# Patient Record
Sex: Female | Born: 1965
Health system: Southern US, Community
[De-identification: ages and names within clinical notes are randomized; demographics above are authoritative.]

## PROBLEM LIST (undated history)

## (undated) DIAGNOSIS — D649 Anemia, unspecified: Secondary | ICD-10-CM

## (undated) DIAGNOSIS — F419 Anxiety disorder, unspecified: Secondary | ICD-10-CM

## (undated) DIAGNOSIS — Z8489 Family history of other specified conditions: Secondary | ICD-10-CM

## (undated) HISTORY — PX: ABDOMINAL HYSTERECTOMY: SHX81

---

## 2000-08-19 ENCOUNTER — Emergency Department (HOSPITAL_COMMUNITY): Admission: EM | Admit: 2000-08-19 | Discharge: 2000-08-19 | Payer: Self-pay | Admitting: *Deleted

## 2003-09-24 ENCOUNTER — Other Ambulatory Visit: Admission: RE | Admit: 2003-09-24 | Discharge: 2003-09-24 | Payer: Self-pay | Admitting: Family Medicine

## 2011-01-04 ENCOUNTER — Emergency Department (HOSPITAL_BASED_OUTPATIENT_CLINIC_OR_DEPARTMENT_OTHER)
Admission: EM | Admit: 2011-01-04 | Discharge: 2011-01-04 | Disposition: A | Discharge status: Home / Self Care | Payer: Managed Care, Other (non HMO) | Attending: Emergency Medicine | Admitting: Emergency Medicine

## 2011-01-04 DIAGNOSIS — J069 Acute upper respiratory infection, unspecified: Secondary | ICD-10-CM | POA: Insufficient documentation

## 2011-01-04 LAB — RAPID STREP SCREEN (MED CTR MEBANE ONLY): Streptococcus, Group A Screen (Direct): NEGATIVE

## 2011-08-08 ENCOUNTER — Emergency Department (HOSPITAL_BASED_OUTPATIENT_CLINIC_OR_DEPARTMENT_OTHER)
Admission: EM | Admit: 2011-08-08 | Discharge: 2011-08-08 | Disposition: A | Discharge status: Home / Self Care | Payer: Managed Care, Other (non HMO) | Attending: Emergency Medicine | Admitting: Emergency Medicine

## 2011-08-08 DIAGNOSIS — R221 Localized swelling, mass and lump, neck: Secondary | ICD-10-CM | POA: Insufficient documentation

## 2011-08-08 DIAGNOSIS — S93609A Unspecified sprain of unspecified foot, initial encounter: Secondary | ICD-10-CM | POA: Insufficient documentation

## 2011-08-08 DIAGNOSIS — K047 Periapical abscess without sinus: Secondary | ICD-10-CM

## 2011-08-08 DIAGNOSIS — R220 Localized swelling, mass and lump, head: Secondary | ICD-10-CM | POA: Insufficient documentation

## 2011-08-08 DIAGNOSIS — K044 Acute apical periodontitis of pulpal origin: Secondary | ICD-10-CM | POA: Insufficient documentation

## 2011-08-08 DIAGNOSIS — X58XXXA Exposure to other specified factors, initial encounter: Secondary | ICD-10-CM | POA: Insufficient documentation

## 2011-08-08 DIAGNOSIS — S93601A Unspecified sprain of right foot, initial encounter: Secondary | ICD-10-CM

## 2011-08-08 MED ORDER — PENICILLIN V POTASSIUM 500 MG PO TABS: 500.0000 mg | ORAL_TABLET | Freq: Three times a day (TID) | ORAL | Status: AC

## 2011-08-08 MED ORDER — IBUPROFEN 800 MG PO TABS: 800.0000 mg | ORAL_TABLET | Freq: Three times a day (TID) | ORAL | Status: AC

## 2011-08-08 MED ORDER — HYDROCODONE-ACETAMINOPHEN 5-325 MG PO TABS: 1.0000 | ORAL_TABLET | ORAL | Status: AC | PRN

## 2011-08-08 NOTE — ED Notes (Signed)
Pt reports right side facial swelling, pain and "numbness" that started this am.  Also reports ankle pain.

## 2011-08-08 NOTE — ED Provider Notes (Signed)
History     CSN: [id number] Arrival date & time: 08/08/2011  4:00 PM   None     Chief Complaint  Patient presents with  . Facial Swelling  . Ankle Pain  . Dental Pain    (Consider location/radiation/quality/duration/timing/severity/associated sxs/prior treatment) HPI History provided by patient.  Pt develop right upper toothache yesterday evening.  This morning she woke w/ right cheek edema.  Has not had fever.  H/o dental abscess that presented similarly.  Denies trauma.  Also c/o pain in right proximal foot that started upon waking this am and aggravated by ankle ROM.  Denies trauma.  Denies recent increase in activity.  Family h/o gout.   History reviewed. No pertinent past medical history.  Past Surgical History  Procedure Date  . Cesarean section     No family history on file.  History  Substance Use Topics  . Smoking status: Never Smoker   . Smokeless tobacco: Never Used  . Alcohol Use: Yes     occasional wine    OB History    Grav Para Term Preterm Abortions TAB SAB Ect Mult Living                  Review of Systems  All other systems reviewed and are negative.    Allergies  Percocet  Home Medications   Current Outpatient Rx  Name Route Sig Dispense Refill  . NAPROXEN SODIUM 220 MG PO TABS Oral Take 240 mg by mouth 2 (two) times daily as needed. For pain       BP 129/84  Pulse 84  Temp(Src) 98.5 F (36.9 C) (Oral)  Resp 16  Ht 5\' 4"  (1.626 m)  Wt 210 lb (95.255 kg)  BMI 36.05 kg/m2  SpO2 100%  LMP 07/18/2011  Physical Exam  Nursing note and vitals reviewed. Constitutional: She is oriented to person, place, and time. She appears well-developed and well-nourished.  HENT:  Head: Normocephalic and atraumatic. No trismus in the jaw.  Mouth/Throat: Uvula is midline and oropharynx is clear and moist.       Severe decay and partial avulsions of nearly all teeth.  Right upper 2nd premolar ttp.  Adjacent gingiva appears normal.  Mild edema right  buccal mucosa w/ ttp.  Eyes:       Normal appearance  Neck: Normal range of motion. Neck supple.  Musculoskeletal:       Right foot/ankle w/out deformity, edema or skin changes.  Tenderness at center of proximal foot on dorsal surface.  Pain w/ ROM of ankle.  NV intact.    Lymphadenopathy:    She has no cervical adenopathy.  Neurological: She is alert and oriented to person, place, and time.  Psychiatric: She has a normal mood and affect. Her behavior is normal.    ED Course  Procedures (including critical care time)  Labs Reviewed - No data to display No results found.   1. Dental infection   2. Sprain of right foot       MDM  Pt presents w/ likely periapical abscess of right upper 2nd premolar.  Prescribed vicodin and penicillin and advised f/u with Dentist.  A list of several local dentists provided.  Also c/o non-traumatic right foot pain.  Concerned about FH gout.  No signs of infection/gout on exam.  Likely arthritis (has pain in other joints intermittently as well), sprain or tendonitis.  Recommended conservative therapy w/ NSAID, rest, ice.  Nursing staff placed in ASO.  Return precautions discussed.  Medical screening examination/treatment/procedure(s) were performed by non-physician practitioner and as supervising physician I was immediately available for consultation/collaboration. Osvaldo Human, M.D.      Arie Sabina [place], [place] 08/09/11 1610  Carleene Cooper III, MD 08/09/11 1046

## 2012-05-12 ENCOUNTER — Encounter (HOSPITAL_BASED_OUTPATIENT_CLINIC_OR_DEPARTMENT_OTHER): Payer: Self-pay | Admitting: *Deleted

## 2012-05-12 ENCOUNTER — Emergency Department (HOSPITAL_BASED_OUTPATIENT_CLINIC_OR_DEPARTMENT_OTHER)
Admission: EM | Admit: 2012-05-12 | Discharge: 2012-05-12 | Disposition: A | Discharge status: Home / Self Care | Payer: BC Managed Care – PPO | Attending: Emergency Medicine | Admitting: Emergency Medicine

## 2012-05-12 DIAGNOSIS — M79646 Pain in unspecified finger(s): Secondary | ICD-10-CM

## 2012-05-12 DIAGNOSIS — M79609 Pain in unspecified limb: Secondary | ICD-10-CM | POA: Insufficient documentation

## 2012-05-12 MED ORDER — SULFAMETHOXAZOLE-TMP DS 800-160 MG PO TABS
1.0000 | ORAL_TABLET | Freq: Once | ORAL | Status: AC
  Administered 2012-05-12: 1 via ORAL
  Filled 2012-05-12: qty 1

## 2012-05-12 MED ORDER — HYDROCODONE-ACETAMINOPHEN 5-325 MG PO TABS: 1.0000 | ORAL_TABLET | ORAL | Status: AC | PRN

## 2012-05-12 MED ORDER — SULFAMETHOXAZOLE-TRIMETHOPRIM 800-160 MG PO TABS: 1.0000 | ORAL_TABLET | Freq: Two times a day (BID) | ORAL | Status: AC

## 2012-05-12 MED ORDER — HYDROCODONE-ACETAMINOPHEN 5-325 MG PO TABS
1.0000 | ORAL_TABLET | Freq: Once | ORAL | Status: AC
  Administered 2012-05-12: 1 via ORAL
  Filled 2012-05-12: qty 1

## 2012-05-12 NOTE — ED Notes (Signed)
Acrylic nail was taken off her right 2nd digit today because she thinks she has an infection under the nail. Painful.

## 2012-05-12 NOTE — ED Provider Notes (Signed)
Medical screening examination/treatment/procedure(s) were performed by non-physician practitioner and as supervising physician I was immediately available for consultation/collaboration.  Ethelda Chick, MD 05/12/12 [phone]

## 2012-05-12 NOTE — ED Provider Notes (Signed)
History     CSN: [id number]  Arrival date & time 05/12/12  1729   First MD Initiated Contact with Patient 05/12/12 1903      Chief Complaint  Patient presents with  . Nail Problem    (Consider location/radiation/quality/duration/timing/severity/associated sxs/prior treatment) Patient is a 46 y.o. female presenting with hand pain. The history is provided by the patient.  Hand Pain This is a new problem. The current episode started yesterday. The problem occurs constantly. Pertinent negatives include no fever. Associated symptoms comments: She began having pain to distal right index finger yesterday, worse today. She went to her nail salon and had the acrylic nail removed and presents with increasing pain throughout today. No redness. No drainage around the nail. Marland Kitchen    History reviewed. No pertinent past medical history.  Past Surgical History  Procedure Date  . Cesarean section     No family history on file.  History  Substance Use Topics  . Smoking status: Never Smoker   . Smokeless tobacco: Never Used  . Alcohol Use: Yes     occasional wine    OB History    Grav Para Term Preterm Abortions TAB SAB Ect Mult Living                  Review of Systems  Constitutional: Negative for fever.  Musculoskeletal:       See HPI.  Skin: Negative for color change.    Allergies  Percocet  Home Medications   Current Outpatient Rx  Name Route Sig Dispense Refill  . ACETAMINOPHEN 500 MG PO TABS Oral Take 1,000 mg by mouth every 6 (six) hours as needed. For pain      BP 158/104  Pulse 103  Temp 98.3 F (36.8 C) (Oral)  Resp 21  SpO2 100%  Physical Exam  Constitutional: She is oriented to person, place, and time. She appears well-developed and well-nourished. No distress.  Musculoskeletal:       Right index finger unremarkable in appearance. Tender to palpation lateral finger distally. Pad soft, no evidence of felon development. No purulence around nail border. No  subungual hematoma. Minimal swelling lateral distal finger where tender.   Neurological: She is alert and oriented to person, place, and time.    ED Course  Procedures (including critical care time)  Labs Reviewed - No data to display No results found.   No diagnosis found.  1. Finger pain   MDM  Will place on abx and encourage 2 day recheck if no better or if worsens.        Rodena Medin, PA-C 05/12/12 1922

## 2013-01-26 NOTE — Consult Note (Deleted)
[name], [name]             ACCOUNT NO.:  [id number]  MEDICAL RECORD NO.:  [id number]  LOCATION:  [facility]                         FACILITY:  [facility]  PHYSICIAN:  Duke Salvia. Marcelle Overlie, M.D.DATE OF BIRTH:  1966/07/18  DATE OF CONSULTATION: DATE OF DISCHARGE:                                CONSULTATION   CHIEF COMPLAINT:  Menorrhagia, leiomyoma.  HISTORY OF PRESENT ILLNESS:  A 47 year old, G5, P5, LC6, one set of twins prior tubal.  This patient was originally seen on October 13th with complaints of anemia and menorrhagia.  Evaluation in November 2013 by [facility] showed multiple small fibroids 2.8, 3.2, 2.1, 2.6, 2.1 and possible fundal submucous fibroid adnexa negative.  Hemoglobin at that time was 8.8.  She was started on OCPs to decrease her menorrhagia along with iron.  Because of her smoking history, she understood that this was not going to be long range solution for her bleeding.  This was worsened over the last 4-6 months.  She presents at this time for definitive LAVH with bilateral salpingectomy.  This procedure including specific risks related to bleeding, infection, transfusion, adjacent organ injury, the possible need to complete surgery by open technique.  Other risks related to wound infection and phlebitis along with her expected recovery time reviewed, which she understands and accepts.  PAST MEDICAL HISTORY:  Allergies none.  CURRENT MEDICATIONS:  Ferralet 90.  SURGICAL HISTORY:  Five vaginal deliveries, one cesarean section.  She has had hand surgery.  REVIEW OF SYSTEMS:  Significant for prior history of treated Chlamydia and UTI.  FAMILY HISTORY:  Significant for asthma, UTI, diabetes, arthritis, hypertension, and lung and brain cancer.  SOCIAL HISTORY:  Denies drug or alcohol use.  She does smoke 1 pack per week.  She is married.  Her last Pap dated February 14th was ASCUS negative high-risk HPV.  PHYSICAL EXAMINATION:  VITAL SIGNS:  Temperature 98.2, blood  pressure 130/72. HEENT:  Unremarkable. NECK:  Supple without masses. LUNGS:  Clear. CARDIOVASCULAR:  Regular rate and rhythm without murmurs, rubs, or gallops. BREASTS:  Negative. ABDOMEN:  Benign. PELVIC:  Normal external genitalia.  Vagina and cervix clear.  Uterus is 6-8 week size, mobile.  Adnexa negative. EXTREMITIES:  Unremarkable. NEUROLOGIC:  Unremarkable.  IMPRESSION:  Symptomatic leiomyoma with anemia and menorrhagia.  PLAN:  LAVH, bilateral salpingectomy, conservation of ovaries, and the possible reduction in risk of ovarian cancer in later life with bilateral salpingectomy.  Discussed with her risks reviewed regarding the surgery as above.  The patient understands and accepts.     Kiele Heavrin M. Marcelle Overlie, M.D.     RMH/MEDQ  D:  01/26/2013  T:  01/26/2013  Job:  [id number]

## 2013-01-26 NOTE — H&P (Signed)
DARRIN APODACA  DICTATION # [id number] CSN# [id number]   Meriel Pica, MD 01/26/2013 10:39 AM

## 2013-01-27 ENCOUNTER — Encounter (HOSPITAL_COMMUNITY): Payer: Self-pay

## 2013-01-27 ENCOUNTER — Encounter (HOSPITAL_COMMUNITY)
Admission: RE | Admit: 2013-01-27 | Discharge: 2013-01-27 | Disposition: A | Discharge status: Home / Self Care | Payer: BC Managed Care – PPO | Source: Ambulatory Visit | Attending: Obstetrics and Gynecology | Admitting: Obstetrics and Gynecology

## 2013-01-27 DIAGNOSIS — Z01818 Encounter for other preprocedural examination: Secondary | ICD-10-CM | POA: Insufficient documentation

## 2013-01-27 DIAGNOSIS — Z01812 Encounter for preprocedural laboratory examination: Secondary | ICD-10-CM | POA: Insufficient documentation

## 2013-01-27 HISTORY — DX: Family history of other specified conditions: Z84.89

## 2013-01-27 HISTORY — DX: Anemia, unspecified: D64.9

## 2013-01-27 HISTORY — DX: Anxiety disorder, unspecified: F41.9

## 2013-01-27 LAB — SURGICAL PCR SCREEN
MRSA, PCR: NEGATIVE
Staphylococcus aureus: NEGATIVE

## 2013-01-27 LAB — CBC
Hemoglobin: 10.8 g/dL — ABNORMAL LOW (ref 12.0–15.0)
MCHC: 31.4 g/dL (ref 30.0–36.0)
RDW: 18.1 % — ABNORMAL HIGH (ref 11.5–15.5)

## 2013-01-27 NOTE — Patient Instructions (Addendum)
20 NOELENE GANG  01/27/2013   Your procedure is scheduled on:  02/03/13  Enter through the Main Entrance of [facility] at 6 AM.  Pick up the phone at the desk and dial [phone].   Call this number if you have problems the morning of surgery: [phone]   Remember:   Do not eat food:After Midnight.  Do not drink clear liquids: After Midnight.  Take these medicines the morning of surgery with A SIP OF WATER: May take Xanax if needed.   Do not wear jewelry, make-up or nail polish.  Do not wear lotions, powders, or perfumes. You may wear deodorant.  Do not shave 48 hours prior to surgery.  Do not bring valuables to the hospital.  Contacts, dentures or bridgework may not be worn into surgery.  Leave suitcase in the car. After surgery it may be brought to your room.  For patients admitted to the hospital, checkout time is 11:00 AM the day of discharge.   Patients discharged the day of surgery will not be allowed to drive home.  Name and phone number of your driver: NA  Special Instructions: Shower using CHG 2 nights before surgery and the night before surgery.  If you shower the day of surgery use CHG.  Use special wash - you have one bottle of CHG for all showers.  You should use approximately 1/3 of the bottle for each shower.   Please read over the following fact sheets that you were given: MRSA Information

## 2013-02-02 ENCOUNTER — Encounter (HOSPITAL_COMMUNITY): Payer: Self-pay | Admitting: Anesthesiology

## 2013-02-02 NOTE — Anesthesia Preprocedure Evaluation (Addendum)
Anesthesia Evaluation  Patient identified by MRN, date of birth, ID band Patient awake    Reviewed: Allergy & Precautions, H&P , NPO status , Patient's Chart, lab work & pertinent test results  History of Anesthesia Complications (+) Family history of anesthesia reaction  Airway Mallampati: III TM Distance: >3 FB Neck ROM: Full    Dental  (+) Caps, Chipped and Poor Dentition   Pulmonary neg pulmonary ROS,  breath sounds clear to auscultation  Pulmonary exam normal       Cardiovascular negative cardio ROS  Rhythm:Regular Rate:Normal     Neuro/Psych Anxiety negative neurological ROS     GI/Hepatic negative GI ROS, Neg liver ROS,   Endo/Other  negative endocrine ROSObesity  Renal/GU negative Renal ROS  negative genitourinary   Musculoskeletal negative musculoskeletal ROS (+)   Abdominal (+) + obese,   Peds  Hematology negative hematology ROS (+)   Anesthesia Other Findings   Reproductive/Obstetrics Leiomyoma menorrhagia                          Anesthesia Physical Anesthesia Plan  ASA: II  Anesthesia Plan: General   Post-op Pain Management:    Induction: Intravenous  Airway Management Planned: Oral ETT  Additional Equipment:   Intra-op Plan:   Post-operative Plan: Extubation in OR  Informed Consent: I have reviewed the patients History and Physical, chart, labs and discussed the procedure including the risks, benefits and alternatives for the proposed anesthesia with the patient or authorized representative who has indicated his/her understanding and acceptance.   Dental advisory given  Plan Discussed with: CRNA, Anesthesiologist and Surgeon  Anesthesia Plan Comments:         Anesthesia Quick Evaluation

## 2013-02-03 ENCOUNTER — Encounter (HOSPITAL_COMMUNITY)
Admission: RE | Disposition: A | Discharge status: Home / Self Care | Payer: Self-pay | Source: Ambulatory Visit | Attending: Obstetrics and Gynecology

## 2013-02-03 ENCOUNTER — Encounter (HOSPITAL_COMMUNITY): Payer: Self-pay | Admitting: Anesthesiology

## 2013-02-03 ENCOUNTER — Observation Stay (HOSPITAL_COMMUNITY)
Admission: RE | Admit: 2013-02-03 | Discharge: 2013-02-04 | Disposition: A | Discharge status: Home / Self Care | Payer: BC Managed Care – PPO | Source: Ambulatory Visit | Attending: Obstetrics and Gynecology | Admitting: Obstetrics and Gynecology

## 2013-02-03 ENCOUNTER — Ambulatory Visit (HOSPITAL_COMMUNITY): Payer: BC Managed Care – PPO | Admitting: Anesthesiology

## 2013-02-03 DIAGNOSIS — N920 Excessive and frequent menstruation with regular cycle: Secondary | ICD-10-CM | POA: Insufficient documentation

## 2013-02-03 DIAGNOSIS — D219 Benign neoplasm of connective and other soft tissue, unspecified: Secondary | ICD-10-CM

## 2013-02-03 DIAGNOSIS — D6489 Other specified anemias: Secondary | ICD-10-CM | POA: Insufficient documentation

## 2013-02-03 DIAGNOSIS — N8000 Endometriosis of the uterus, unspecified: Secondary | ICD-10-CM | POA: Insufficient documentation

## 2013-02-03 DIAGNOSIS — D251 Intramural leiomyoma of uterus: Principal | ICD-10-CM | POA: Insufficient documentation

## 2013-02-03 DIAGNOSIS — D250 Submucous leiomyoma of uterus: Secondary | ICD-10-CM | POA: Insufficient documentation

## 2013-02-03 HISTORY — PX: LAPAROSCOPIC ASSISTED VAGINAL HYSTERECTOMY: SHX5398

## 2013-02-03 SURGERY — HYSTERECTOMY, VAGINAL, LAPAROSCOPY-ASSISTED
Anesthesia: General | Site: Abdomen | Wound class: Clean Contaminated

## 2013-02-03 MED ORDER — GLYCOPYRROLATE 0.2 MG/ML IJ SOLN
INTRAMUSCULAR | Status: AC
  Filled 2013-02-03: qty 3

## 2013-02-03 MED ORDER — LACTATED RINGERS IR SOLN
Status: DC | PRN
  Administered 2013-02-03: 3000 mL

## 2013-02-03 MED ORDER — DEXTROSE IN LACTATED RINGERS 5 % IV SOLN
INTRAVENOUS | Status: DC
  Administered 2013-02-03 – 2013-02-04 (×2): via INTRAVENOUS

## 2013-02-03 MED ORDER — BUPIVACAINE HCL (PF) 0.25 % IJ SOLN
INTRAMUSCULAR | Status: DC | PRN
  Administered 2013-02-03: 5 mL

## 2013-02-03 MED ORDER — LACTATED RINGERS IV SOLN
INTRAVENOUS | Status: DC
  Administered 2013-02-03 (×3): via INTRAVENOUS

## 2013-02-03 MED ORDER — PROPOFOL 10 MG/ML IV BOLUS
INTRAVENOUS | Status: DC | PRN
  Administered 2013-02-03: 170 mg via INTRAVENOUS
  Administered 2013-02-03: 30 mg via INTRAVENOUS

## 2013-02-03 MED ORDER — BUPIVACAINE HCL (PF) 0.25 % IJ SOLN
INTRAMUSCULAR | Status: AC
  Filled 2013-02-03: qty 30

## 2013-02-03 MED ORDER — MEPERIDINE HCL 25 MG/ML IJ SOLN: 6.2500 mg | INTRAMUSCULAR | Status: DC | PRN

## 2013-02-03 MED ORDER — CEFAZOLIN SODIUM-DEXTROSE 2-3 GM-% IV SOLR: 2.0000 g | INTRAVENOUS | Status: DC

## 2013-02-03 MED ORDER — CEFAZOLIN SODIUM-DEXTROSE 2-3 GM-% IV SOLR
INTRAVENOUS | Status: AC
  Administered 2013-02-03: 2 g via INTRAVENOUS
  Filled 2013-02-03: qty 50

## 2013-02-03 MED ORDER — MORPHINE SULFATE (PF) 1 MG/ML IV SOLN
INTRAVENOUS | Status: DC
  Administered 2013-02-03: 11:00:00 via INTRAVENOUS
  Administered 2013-02-03: 9 mL via INTRAVENOUS
  Administered 2013-02-03: 11 mL via INTRAVENOUS
  Administered 2013-02-03: 5 mg via INTRAVENOUS
  Administered 2013-02-03: 20:00:00 via INTRAVENOUS
  Administered 2013-02-04: 8 mg via INTRAVENOUS
  Administered 2013-02-04: 4 mg via INTRAVENOUS
  Filled 2013-02-03 (×2): qty 25

## 2013-02-03 MED ORDER — PROPOFOL 10 MG/ML IV EMUL
INTRAVENOUS | Status: AC
  Filled 2013-02-03: qty 20

## 2013-02-03 MED ORDER — ROCURONIUM BROMIDE 50 MG/5ML IV SOLN
INTRAVENOUS | Status: AC
  Filled 2013-02-03: qty 1

## 2013-02-03 MED ORDER — FENTANYL CITRATE 0.05 MG/ML IJ SOLN
INTRAMUSCULAR | Status: AC
  Filled 2013-02-03: qty 5

## 2013-02-03 MED ORDER — ROCURONIUM BROMIDE 100 MG/10ML IV SOLN
INTRAVENOUS | Status: DC | PRN
  Administered 2013-02-03: 35 mg via INTRAVENOUS
  Administered 2013-02-03: 5 mg via INTRAVENOUS

## 2013-02-03 MED ORDER — KETOROLAC TROMETHAMINE 30 MG/ML IJ SOLN: 30.0000 mg | Freq: Four times a day (QID) | INTRAMUSCULAR | Status: DC

## 2013-02-03 MED ORDER — METOCLOPRAMIDE HCL 5 MG/ML IJ SOLN: 10.0000 mg | Freq: Once | INTRAMUSCULAR | Status: DC | PRN

## 2013-02-03 MED ORDER — SODIUM CHLORIDE 0.9 % IJ SOLN
INTRAMUSCULAR | Status: DC | PRN
  Administered 2013-02-03: 3 mL

## 2013-02-03 MED ORDER — LIDOCAINE HCL (CARDIAC) 20 MG/ML IV SOLN
INTRAVENOUS | Status: AC
  Filled 2013-02-03: qty 5

## 2013-02-03 MED ORDER — DIPHENHYDRAMINE HCL 50 MG/ML IJ SOLN: 12.5000 mg | Freq: Four times a day (QID) | INTRAMUSCULAR | Status: DC | PRN

## 2013-02-03 MED ORDER — GLYCOPYRROLATE 0.2 MG/ML IJ SOLN
INTRAMUSCULAR | Status: AC
  Filled 2013-02-03: qty 1

## 2013-02-03 MED ORDER — ZOLPIDEM TARTRATE 5 MG PO TABS: 5.0000 mg | ORAL_TABLET | Freq: Every evening | ORAL | Status: DC | PRN

## 2013-02-03 MED ORDER — HYDROMORPHONE HCL PF 1 MG/ML IJ SOLN
INTRAMUSCULAR | Status: AC
  Filled 2013-02-03: qty 1

## 2013-02-03 MED ORDER — ONDANSETRON HCL 4 MG/2ML IJ SOLN: 4.0000 mg | Freq: Four times a day (QID) | INTRAMUSCULAR | Status: DC | PRN

## 2013-02-03 MED ORDER — ACETAMINOPHEN 10 MG/ML IV SOLN
INTRAVENOUS | Status: AC
  Filled 2013-02-03: qty 100

## 2013-02-03 MED ORDER — FENTANYL CITRATE 0.05 MG/ML IJ SOLN
INTRAMUSCULAR | Status: DC | PRN
  Administered 2013-02-03: 100 ug via INTRAVENOUS
  Administered 2013-02-03 (×3): 50 ug via INTRAVENOUS
  Administered 2013-02-03: 100 ug via INTRAVENOUS

## 2013-02-03 MED ORDER — FENTANYL CITRATE 0.05 MG/ML IJ SOLN
INTRAMUSCULAR | Status: AC
  Administered 2013-02-03: 50 ug via INTRAVENOUS
  Filled 2013-02-03: qty 2

## 2013-02-03 MED ORDER — DEXAMETHASONE SODIUM PHOSPHATE 10 MG/ML IJ SOLN
INTRAMUSCULAR | Status: AC
  Filled 2013-02-03: qty 1

## 2013-02-03 MED ORDER — NEOSTIGMINE METHYLSULFATE 1 MG/ML IJ SOLN
INTRAMUSCULAR | Status: DC | PRN
  Administered 2013-02-03: 4 mg via INTRAVENOUS

## 2013-02-03 MED ORDER — KETOROLAC TROMETHAMINE 30 MG/ML IJ SOLN
30.0000 mg | Freq: Four times a day (QID) | INTRAMUSCULAR | Status: DC
  Administered 2013-02-03 – 2013-02-04 (×3): 30 mg via INTRAVENOUS
  Filled 2013-02-03 (×3): qty 1

## 2013-02-03 MED ORDER — DIPHENHYDRAMINE HCL 12.5 MG/5ML PO ELIX
12.5000 mg | ORAL_SOLUTION | Freq: Four times a day (QID) | ORAL | Status: DC | PRN
  Administered 2013-02-04: 12.5 mg via ORAL
  Filled 2013-02-03: qty 5

## 2013-02-03 MED ORDER — KETOROLAC TROMETHAMINE 30 MG/ML IJ SOLN: 30.0000 mg | Freq: Once | INTRAMUSCULAR | Status: DC

## 2013-02-03 MED ORDER — MIDAZOLAM HCL 2 MG/2ML IJ SOLN
INTRAMUSCULAR | Status: AC
  Filled 2013-02-03: qty 2

## 2013-02-03 MED ORDER — ONDANSETRON HCL 4 MG PO TABS: 4.0000 mg | ORAL_TABLET | Freq: Four times a day (QID) | ORAL | Status: DC | PRN

## 2013-02-03 MED ORDER — SODIUM CHLORIDE 0.9 % IJ SOLN: 9.0000 mL | INTRAMUSCULAR | Status: DC | PRN

## 2013-02-03 MED ORDER — ACETAMINOPHEN 10 MG/ML IV SOLN
1000.0000 mg | Freq: Once | INTRAVENOUS | Status: AC
  Administered 2013-02-03: 1000 mg via INTRAVENOUS

## 2013-02-03 MED ORDER — LIDOCAINE HCL (CARDIAC) 20 MG/ML IV SOLN
INTRAVENOUS | Status: DC | PRN
  Administered 2013-02-03: 80 mg via INTRAVENOUS

## 2013-02-03 MED ORDER — NALOXONE HCL 0.4 MG/ML IJ SOLN: 0.4000 mg | INTRAMUSCULAR | Status: DC | PRN

## 2013-02-03 MED ORDER — DEXAMETHASONE SODIUM PHOSPHATE 10 MG/ML IJ SOLN
INTRAMUSCULAR | Status: DC | PRN
  Administered 2013-02-03: 10 mg via INTRAVENOUS

## 2013-02-03 MED ORDER — SCOPOLAMINE 1 MG/3DAYS TD PT72: 1.0000 | MEDICATED_PATCH | TRANSDERMAL | Status: DC

## 2013-02-03 MED ORDER — IBUPROFEN 800 MG PO TABS
800.0000 mg | ORAL_TABLET | Freq: Three times a day (TID) | ORAL | Status: DC | PRN
  Administered 2013-02-04: 800 mg via ORAL
  Filled 2013-02-03: qty 1

## 2013-02-03 MED ORDER — FENTANYL CITRATE 0.05 MG/ML IJ SOLN
25.0000 ug | INTRAMUSCULAR | Status: DC | PRN
  Administered 2013-02-03 (×2): 50 ug via INTRAVENOUS

## 2013-02-03 MED ORDER — MIDAZOLAM HCL 5 MG/5ML IJ SOLN
INTRAMUSCULAR | Status: DC | PRN
  Administered 2013-02-03: 2 mg via INTRAVENOUS

## 2013-02-03 MED ORDER — NEOSTIGMINE METHYLSULFATE 1 MG/ML IJ SOLN
INTRAMUSCULAR | Status: AC
  Filled 2013-02-03: qty 1

## 2013-02-03 MED ORDER — SCOPOLAMINE 1 MG/3DAYS TD PT72
MEDICATED_PATCH | TRANSDERMAL | Status: AC
  Administered 2013-02-03: 1.5 mg via TRANSDERMAL
  Filled 2013-02-03: qty 1

## 2013-02-03 MED ORDER — ONDANSETRON HCL 4 MG/2ML IJ SOLN
INTRAMUSCULAR | Status: DC | PRN
  Administered 2013-02-03: 4 mg via INTRAVENOUS

## 2013-02-03 MED ORDER — MENTHOL 3 MG MT LOZG: 1.0000 | LOZENGE | OROMUCOSAL | Status: DC | PRN

## 2013-02-03 MED ORDER — GLYCOPYRROLATE 0.2 MG/ML IJ SOLN
INTRAMUSCULAR | Status: DC | PRN
  Administered 2013-02-03: 0.1 mg via INTRAVENOUS
  Administered 2013-02-03: .8 mg via INTRAVENOUS

## 2013-02-03 MED ORDER — ONDANSETRON HCL 4 MG/2ML IJ SOLN
INTRAMUSCULAR | Status: AC
  Filled 2013-02-03: qty 2

## 2013-02-03 SURGICAL SUPPLY — 33 items
CABLE HIGH FREQUENCY MONO STRZ (ELECTRODE) IMPLANT
CATH ROBINSON RED A/P 16FR (CATHETERS) ×3 IMPLANT
CLOTH BEACON ORANGE TIMEOUT ST (SAFETY) ×3 IMPLANT
CONT PATH 16OZ SNAP LID 3702 (MISCELLANEOUS) ×3 IMPLANT
COVER TABLE BACK 60X90 (DRAPES) ×3 IMPLANT
DECANTER SPIKE VIAL GLASS SM (MISCELLANEOUS) ×3 IMPLANT
DERMABOND ADVANCED (GAUZE/BANDAGES/DRESSINGS) ×1
DERMABOND ADVANCED .7 DNX12 (GAUZE/BANDAGES/DRESSINGS) ×2 IMPLANT
ELECT LIGASURE LONG (ELECTRODE) ×3 IMPLANT
ELECT REM PT RETURN 9FT ADLT (ELECTROSURGICAL) ×3
ELECTRODE REM PT RTRN 9FT ADLT (ELECTROSURGICAL) ×2 IMPLANT
GLOVE BIO SURGEON STRL SZ7 (GLOVE) ×6 IMPLANT
GLOVE BIOGEL PI IND STRL 6.5 (GLOVE) ×2 IMPLANT
GLOVE BIOGEL PI INDICATOR 6.5 (GLOVE) ×1
GOWN STRL REIN XL XLG (GOWN DISPOSABLE) ×12 IMPLANT
NEEDLE INSUFFLATION 120MM (ENDOMECHANICALS) ×3 IMPLANT
NS IRRIG 1000ML POUR BTL (IV SOLUTION) ×3 IMPLANT
PACK LAVH (CUSTOM PROCEDURE TRAY) ×3 IMPLANT
PROTECTOR NERVE ULNAR (MISCELLANEOUS) ×3 IMPLANT
SEALER TISSUE G2 CVD JAW 45CM (ENDOMECHANICALS) ×3 IMPLANT
SET IRRIG TUBING LAPAROSCOPIC (IRRIGATION / IRRIGATOR) IMPLANT
SUT MON AB 2-0 CT1 36 (SUTURE) ×6 IMPLANT
SUT VIC AB 0 CT1 18XCR BRD8 (SUTURE) ×2 IMPLANT
SUT VIC AB 0 CT1 36 (SUTURE) ×6 IMPLANT
SUT VIC AB 0 CT1 8-18 (SUTURE) ×1
SUT VICRYL 0 TIES 12 18 (SUTURE) ×3 IMPLANT
SUT VICRYL 4-0 PS2 18IN ABS (SUTURE) ×3 IMPLANT
TOWEL OR 17X24 6PK STRL BLUE (TOWEL DISPOSABLE) ×6 IMPLANT
TRAY FOLEY CATH 14FR (SET/KITS/TRAYS/PACK) ×3 IMPLANT
TROCAR OPTI TIP 5M 100M (ENDOMECHANICALS) ×6 IMPLANT
TROCAR XCEL DIL TIP R 11M (ENDOMECHANICALS) ×3 IMPLANT
WARMER LAPAROSCOPE (MISCELLANEOUS) ×3 IMPLANT
WATER STERILE IRR 1000ML POUR (IV SOLUTION) ×3 IMPLANT

## 2013-02-03 NOTE — Transfer of Care (Signed)
Immediate Anesthesia Transfer of Care Note  Patient: [name]  Procedure(s) Performed: Procedure(s): LAPAROSCOPIC ASSISTED VAGINAL HYSTERECTOMY (N/A)  Patient Location: PACU  Anesthesia Type:General  Level of Consciousness: awake, alert  and oriented  Airway & Oxygen Therapy: Patient Spontanous Breathing and Patient connected to nasal cannula oxygen  Post-op Assessment: Report given to PACU RN and Post -op Vital signs reviewed and stable  Post vital signs: Reviewed and stable  Complications: No apparent anesthesia complications

## 2013-02-03 NOTE — Op Note (Signed)
Preoperative diagnosis: Symptomatic leiomyoma with anemia  Postoperative diagnosis: Same  Procedure: LAVH  Surgeon: Marcelle Overlie  Assistant: McComb  EBL: 400 cc  Specimens removed: Uterus and cervix, to pathology  Drains: Foley catheter  Procedure and findings:  The patient taken the operating room after an adequate level of general anesthesia was obtained the legs in stirrups the abdomen perineum and vagina prepped and draped in usual fashion for LAVH Foley catheter positioned draining clear urine. Appropriate timeout for taken at that point he UA carried out uterus was 10 week size mobile adnexa negative. Hulka tenaculum was positioned. Attention directed to the abdomen where the subumbilical area was infiltrated with quarter percent Marcaine plain small incision was made in the varies needle was introduced without difficulty. Its intra-abdominal position was verified by pressure water testing. After a 3 L pneumoperitoneum syncopated lap scopic trocar and sleeve were then introduced that difficulty. There was no evidence of any bleeding or trauma. 3 finger restaurant symphysis in the midline a 5 mm trocar was inserted under direct visualization. The patient was then placed in Trendelenburg and the pelvic findings as follows:  The uterus itself was 10 week size irregular with fibroids bilateral adnexa unremarkable the tubes were partially scarred and pulled up against the broad ligament, precluding use the salpingectomy. The ovary so were free and clear. Decision made to not proceed with salpingectomy, using the in seal device the utero-ovarian pedicles were coagulated and divided down to and including the round ligament on each side with excellent hemostasis. The vaginal portion the procedure started at this point  The legs were extended weighted speculum was positioned cervix grasped with tenaculum cervical vaginal mucosa was incised with the Bovie posterior culdotomy performed without difficulty.  The bladder was advanced superiorly with sharp and blunt dissection until the peritoneum could be identified this was entered sharply and a retractor was then used to gently elevate the bladder out of the field. In sequential manner the uterosacral ligament, cardinal ligament uterine basket or pedicles and upper broad ligament pedicles were coagulated and divided with the LigaSure device. The fundus of the uterus is in delivered posteriorly remaining pedicles clamped divided and free tie with 0 Vicryl ties. Vaginal cuff was then closed from 3 to 9:00 with a running locked 2-0 Monocryl suture. Prior to closure sponge denies precast approach correct x2 vaginal mucosa then closed right to left with interrupted 2-0 Monocryl sutures with excellent hemostasis. Clear urine was noted throughout. Repeat laparoscopy was then carried out at this point the Nezhat was then used to copiously irrigate the pelvis this was aspirated the operative site was inspected carefully noted be hemostatic even at reduced pressure. Its was removed gas less escape because closed with 4-0 Monocryl subcuticular sutures and Dermabond she tolerated this well went to recovery in good condition.  Dictated with [facility]  Kalvyn Desa M. Milana Obey.D.

## 2013-02-03 NOTE — Anesthesia Postprocedure Evaluation (Signed)
  Anesthesia Post-op Note  Anesthesia Post Note  Patient: [name]  Procedure(s) Performed: Procedure(s) (LRB): LAPAROSCOPIC ASSISTED VAGINAL HYSTERECTOMY (N/A)  Anesthesia type: General  Patient location: PACU  Post pain: Pain level controlled  Post assessment: Post-op Vital signs reviewed  Last Vitals:  Filed Vitals:   02/03/13 1000  BP: 142/80  Pulse: 61  Temp: 36.4 C  Resp: 16    Post vital signs: Reviewed  Level of consciousness: sedated  Complications: No apparent anesthesia complications

## 2013-02-03 NOTE — Anesthesia Procedure Notes (Signed)
Procedure Name: Intubation Date/Time: 02/03/2013 7:34 AM Performed by: Graciela Husbands Pre-anesthesia Checklist: Suction available, Emergency Drugs available, Timeout performed, Patient being monitored and Patient identified Patient Re-evaluated:Patient Re-evaluated prior to inductionOxygen Delivery Method: Circle system utilized Preoxygenation: Pre-oxygenation with 100% oxygen Intubation Type: IV induction Ventilation: Mask ventilation without difficulty Laryngoscope Size: Mac and 3 Grade View: Grade I Tube size: 7.0 mm Number of attempts: 1 Airway Equipment and Method: Patient positioned with wedge pillow and Stylet Placement Confirmation: ETT inserted through vocal cords under direct vision,  positive ETCO2 and breath sounds checked- equal and bilateral Secured at: 21 cm Tube secured with: Tape Dental Injury: Teeth and Oropharynx as per pre-operative assessment

## 2013-02-03 NOTE — Progress Notes (Signed)
The patient was re-examined with no change in status 

## 2013-02-03 NOTE — Anesthesia Postprocedure Evaluation (Signed)
  Anesthesia Post-op Note  Patient: [name]  Procedure(s) Performed: Procedure(s): LAPAROSCOPIC ASSISTED VAGINAL HYSTERECTOMY (N/A)  Patient Location: [facility]  Anesthesia Type:General  Level of Consciousness: awake, alert  and oriented  Airway and Oxygen Therapy: Patient Spontanous Breathing and Patient connected to nasal cannula oxygen  Post-op Pain: mild  Post-op Assessment: Post-op Vital signs reviewed and Patient's Cardiovascular Status Stable  Post-op Vital Signs: Reviewed and stable  Complications: No apparent anesthesia complications

## 2013-02-03 NOTE — H&P (Signed)
[name], [name]             ACCOUNT NO.:  [id number]  MEDICAL RECORD NO.:  [id number]  LOCATION:  [facility]                         FACILITY:  [facility]  PHYSICIAN:  Duke Salvia. Marcelle Overlie, M.D.DATE OF BIRTH:  1965/11/13  DATE OF ADMISSION:  01/13/2013 DATE OF DISCHARGE:                             HISTORY & PHYSICAL   CHIEF COMPLAINT:  Menorrhagia, leiomyoma.  HISTORY OF PRESENT ILLNESS:  A 47 year old, G5, P5, LC6, one set of twins prior tubal.  This patient was originally seen on October 13th with complaints of anemia and menorrhagia.  Evaluation in November 2013 by [facility] showed multiple small fibroids 2.8, 3.2, 2.1, 2.6, 2.1 and possible fundal submucous fibroid adnexa negative.  Hemoglobin at that time was 8.8.  She was started on OCPs to decrease her menorrhagia along with iron.  Because of her smoking history, she understood that this was not going to be long range solution for her bleeding.  This was worsened over the last 4-6 months.  She presents at this time for definitive LAVH with bilateral salpingectomy.  This procedure including specific risks related to bleeding, infection, transfusion, adjacent organ injury, the possible need to complete surgery by open technique.  Other risks related to wound infection and phlebitis along with her expected recovery time reviewed, which she understands and accepts.  PAST MEDICAL HISTORY:  Allergies none.  CURRENT MEDICATIONS:  Ferralet 90.  SURGICAL HISTORY:  Five vaginal deliveries, one cesarean section.  She has had hand surgery.  REVIEW OF SYSTEMS:  Significant for prior history of treated Chlamydia and UTI.  FAMILY HISTORY:  Significant for asthma, UTI, diabetes, arthritis, hypertension, and lung and brain cancer.  SOCIAL HISTORY:  Denies drug or alcohol use.  She does smoke 1 pack per week.  She is married.  Her last Pap dated February 14th was ASCUS negative high-risk HPV.  PHYSICAL EXAMINATION:  VITAL SIGNS:  Temperature  98.2, blood pressure 130/72. HEENT:  Unremarkable. NECK:  Supple without masses. LUNGS:  Clear. CARDIOVASCULAR:  Regular rate and rhythm without murmurs, rubs, or gallops. BREASTS:  Negative. ABDOMEN:  Benign. PELVIC:  Normal external genitalia.  Vagina and cervix clear.  Uterus is 6-8 week size, mobile.  Adnexa negative. EXTREMITIES:  Unremarkable. NEUROLOGIC:  Unremarkable.  IMPRESSION:  Symptomatic leiomyoma with anemia and menorrhagia.  PLAN:  LAVH, bilateral salpingectomy, conservation of ovaries, and the possible reduction in risk of ovarian cancer in later life with bilateral salpingectomy.  Discussed with her risks reviewed regarding the surgery as above.  The patient understands and accepts.     Tandy Lewin M. Marcelle Overlie, M.D.     RMH/MEDQ  D:  01/26/2013  T:  01/26/2013  Job:  [id number]

## 2013-02-04 ENCOUNTER — Encounter (HOSPITAL_COMMUNITY): Payer: Self-pay | Admitting: Obstetrics and Gynecology

## 2013-02-04 DIAGNOSIS — D219 Benign neoplasm of connective and other soft tissue, unspecified: Secondary | ICD-10-CM

## 2013-02-04 HISTORY — DX: Benign neoplasm of connective and other soft tissue, unspecified: D21.9

## 2013-02-04 LAB — CBC
HCT: 25.3 % — ABNORMAL LOW (ref 36.0–46.0)
Hemoglobin: 7.9 g/dL — ABNORMAL LOW (ref 12.0–15.0)
MCH: 24.7 pg — ABNORMAL LOW (ref 26.0–34.0)
RBC: 3.2 MIL/uL — ABNORMAL LOW (ref 3.87–5.11)

## 2013-02-04 MED ORDER — HYDROCODONE-IBUPROFEN 7.5-200 MG PO TABS: 1.0000 | ORAL_TABLET | Freq: Four times a day (QID) | ORAL | Status: DC | PRN

## 2013-02-04 NOTE — Progress Notes (Signed)
1 Day Post-Op Procedure(s) (LRB): LAPAROSCOPIC ASSISTED VAGINAL HYSTERECTOMY (N/A)  Subjective: Patient reports tolerating PO.    Objective: I have reviewed patient's vital signs and labs.  abd soft + BS  Assessment: s/p Procedure(s): LAPAROSCOPIC ASSISTED VAGINAL HYSTERECTOMY (N/A): stable  Plan: Discharge home  LOS: 1 day    [facility] M 02/04/2013, 9:28 AM

## 2013-02-04 NOTE — Progress Notes (Signed)
Discharge teaching complete.  Pt verbalizes understanding of discharge instructions.  Pt denies pain or discomfort.  Pt vitals stable.  Ambulated out to car. Husband driving home.

## 2013-02-04 NOTE — Discharge Summary (Signed)
Physician Discharge Summary  Patient ID: [name] MRN: [id number] DOB/AGE: 01-31-1966 47 y.o.  Admit date: 02/03/2013 Discharge date: 02/04/2013  Admission Diagnoses:  Discharge Diagnoses:  Active Problems:   Leiomyoma   Discharged Condition: good  Hospital Course: adm for LAVH for fibroids, DC POD # 1, afeb, abd benign  Consults: None  Significant Diagnostic Studies: labs:  Results for orders placed during the hospital encounter of 02/03/13 (from the past 72 hour(s))  PREGNANCY, URINE     Status: None   Collection Time    02/03/13  6:05 AM      Result Value Range   Preg Test, Ur NEGATIVE  NEGATIVE   Comment:            THE SENSITIVITY OF THIS     METHODOLOGY IS >20 mIU/mL.  CBC     Status: Abnormal   Collection Time    02/04/13  5:40 AM      Result Value Range   WBC 8.8  4.0 - 10.5 K/uL   RBC 3.20 (*) 3.87 - 5.11 MIL/uL   Hemoglobin 7.9 (*) 12.0 - 15.0 g/dL   HCT 40.9 (*) 81.1 - 91.4 %   MCV 79.1  78.0 - 100.0 fL   MCH 24.7 (*) 26.0 - 34.0 pg   MCHC 31.2  30.0 - 36.0 g/dL   RDW 78.2 (*) 95.6 - 21.3 %   Platelets 369  150 - 400 K/uL    Treatments: surgery: LAVH  Discharge Exam: Blood pressure 118/81, pulse 72, temperature 97.4 F (36.3 C), temperature source Oral, resp. rate 22, height 5\' 4"  (1.626 m), weight 96.163 kg (212 lb), last menstrual period 07/18/2011, SpO2 98.00%. abd soft + BS, Incs C/D  Disposition: 01-Home or Self Care     Medication List    STOP taking these medications       acetaminophen 500 MG tablet  Commonly known as:  TYLENOL      TAKE these medications       HYDROcodone-ibuprofen 7.5-200 MG per tablet  Commonly known as:  VICOPROFEN  Take 1 tablet by mouth every 6 (six) hours as needed for pain.     Iron 90 (18 FE) MG Tabs  Take by mouth.     XANAX 0.25 MG tablet  Generic drug:  ALPRAZolam  Take 0.25 mg by mouth at bedtime as needed for sleep.           Follow-up Information   Call Meriel Pica, MD.   Contact information:   [address] [place] [address] [phone]       Signed: Meriel Pica 02/04/2013, 9:31 AM

## 2016-01-26 ENCOUNTER — Encounter (HOSPITAL_BASED_OUTPATIENT_CLINIC_OR_DEPARTMENT_OTHER): Payer: Self-pay | Admitting: *Deleted

## 2016-01-26 ENCOUNTER — Emergency Department (HOSPITAL_BASED_OUTPATIENT_CLINIC_OR_DEPARTMENT_OTHER)
Admission: EM | Admit: 2016-01-26 | Discharge: 2016-01-26 | Disposition: A | Discharge status: Home / Self Care | Payer: BLUE CROSS/BLUE SHIELD | Attending: Emergency Medicine | Admitting: Emergency Medicine

## 2016-01-26 DIAGNOSIS — J029 Acute pharyngitis, unspecified: Secondary | ICD-10-CM

## 2016-01-26 DIAGNOSIS — R5383 Other fatigue: Secondary | ICD-10-CM | POA: Diagnosis not present

## 2016-01-26 DIAGNOSIS — K122 Cellulitis and abscess of mouth: Secondary | ICD-10-CM

## 2016-01-26 DIAGNOSIS — I10 Essential (primary) hypertension: Secondary | ICD-10-CM

## 2016-01-26 DIAGNOSIS — D649 Anemia, unspecified: Secondary | ICD-10-CM | POA: Diagnosis not present

## 2016-01-26 DIAGNOSIS — R5381 Other malaise: Secondary | ICD-10-CM | POA: Insufficient documentation

## 2016-01-26 DIAGNOSIS — R0981 Nasal congestion: Secondary | ICD-10-CM | POA: Diagnosis not present

## 2016-01-26 DIAGNOSIS — F419 Anxiety disorder, unspecified: Secondary | ICD-10-CM | POA: Diagnosis not present

## 2016-01-26 LAB — RAPID STREP SCREEN (MED CTR MEBANE ONLY): STREPTOCOCCUS, GROUP A SCREEN (DIRECT): NEGATIVE

## 2016-01-26 MED ORDER — NAPROXEN 375 MG PO TABS: 375.0000 mg | ORAL_TABLET | Freq: Two times a day (BID) | ORAL | Status: DC

## 2016-01-26 MED ORDER — DEXAMETHASONE SODIUM PHOSPHATE 10 MG/ML IJ SOLN
10.0000 mg | Freq: Once | INTRAMUSCULAR | Status: AC
Start: 2016-01-26 — End: 2016-01-26
  Administered 2016-01-26: 10 mg via INTRAMUSCULAR
  Filled 2016-01-26: qty 1

## 2016-01-26 MED ORDER — KETOROLAC TROMETHAMINE 30 MG/ML IJ SOLN
60.0000 mg | Freq: Once | INTRAMUSCULAR | Status: AC
  Administered 2016-01-26: 60 mg via INTRAMUSCULAR
  Filled 2016-01-26: qty 2

## 2016-01-26 MED ORDER — GUAIFENESIN-CODEINE 100-10 MG/5ML PO SOLN: 5.0000 mL | Freq: Four times a day (QID) | ORAL | Status: DC | PRN

## 2016-01-26 MED FILL — NAPROXEN 375 MG TABLET: 375 | 10 days supply | Qty: 20 | Fill #0

## 2016-01-26 MED FILL — GUAIATUSSIN AC LIQUID: 100-10 | 3 days supply | Qty: 120 | Fill #0

## 2016-01-26 NOTE — ED Provider Notes (Signed)
CSN: [id number]     Arrival date & time 01/26/16  1632 History   First MD Initiated Contact with Patient 01/26/16 1700     Chief Complaint  Patient presents with  . Sore Throat     (Consider location/radiation/quality/duration/timing/severity/associated sxs/prior Treatment) HPI [name] is a(n) 50 y.o. female who presents with sore throat. Onset 24 hours ago. + nasal congestion. Headache, malaise. Taking cough drops without sig. Relief. No known contacts with similar. Able to swallow with pain. No contributing pmh.  Past Medical History  Diagnosis Date  . Family history of anesthesia complication   . Anemia   . Anxiety    Past Surgical History  Procedure Laterality Date  . Cesarean section    . Laparoscopic assisted vaginal hysterectomy N/A 02/03/2013    Procedure: LAPAROSCOPIC ASSISTED VAGINAL HYSTERECTOMY;  Surgeon: Margarette Asal, MD;  Location: [facility];  Service: Gynecology;  Laterality: N/A;  . Abdominal hysterectomy     Family History  Problem Relation Age of Onset  . Diabetes Mother   . Cancer Father   . Diabetes Sister    Social History  Substance Use Topics  . Smoking status: Never Smoker   . Smokeless tobacco: Never Used  . Alcohol Use: Yes     Comment: occasional wine   OB History    No data available     Review of Systems  Constitutional: Positive for activity change and fatigue. Negative for fever and chills.  HENT: Positive for sinus pressure, sore throat and voice change.   Respiratory: Negative for cough.   Skin: Negative for rash.      Allergies  Percocet  Home Medications   Prior to Admission medications   Medication Sig Start Date End Date Taking? Authorizing Provider  ALPRAZolam Duanne Moron) 0.25 MG tablet Take 0.25 mg by mouth at bedtime as needed for sleep.    Historical Provider, MD  Ferrous Sulfate (IRON) 90 (18 FE) MG TABS Take by mouth.    Historical Provider, MD  guaiFENesin-codeine 100-10 MG/5ML syrup Take 5-10 mLs by mouth  every 6 (six) hours as needed for cough. 01/26/16   Briseyda Fehr, PA-C  HYDROcodone-ibuprofen (VICOPROFEN) 7.5-200 MG per tablet Take 1 tablet by mouth every 6 (six) hours as needed for pain. 02/04/13   Molli Posey, MD  naproxen (NAPROSYN) 375 MG tablet Take 1 tablet (375 mg total) by mouth 2 (two) times daily. 01/26/16   Graclyn Lawther, PA-C   BP 142/96 mmHg  Pulse 88  Temp(Src) 98.1 F (36.7 C) (Oral)  Resp 20  Ht 5\' 4"  (1.626 m)  Wt 104.327 kg  BMI 39.46 kg/m2  SpO2 99%  LMP 01/15/2013 Physical Exam  Constitutional: She is oriented to person, place, and time. She appears well-developed and well-nourished. No distress.  HENT:  Head: Normocephalic and atraumatic.  Uvula is red, swollen. No tonsilar hypertrophy, erythema or exudate.  Eyes: Conjunctivae are normal. No scleral icterus.  Neck: Normal range of motion.  Cardiovascular: Normal rate, regular rhythm and normal heart sounds.  Exam reveals no gallop and no friction rub.   No murmur heard. Pulmonary/Chest: Effort normal and breath sounds normal. No respiratory distress.  Abdominal: Soft. Bowel sounds are normal. She exhibits no distension and no mass. There is no tenderness. There is no guarding.  Lymphadenopathy:    She has cervical adenopathy.  Neurological: She is alert and oriented to person, place, and time.  Skin: Skin is warm and dry. She is not diaphoretic.  Nursing note and  vitals reviewed.   ED Course  Procedures (including critical care time) Labs Review Labs Reviewed  RAPID STREP SCREEN (NOT AT [facility])  CULTURE, GROUP A STREP [facility])    Imaging Review No results found. I have personally reviewed and evaluated these images and lab results as part of my medical decision-making.   EKG Interpretation None      MDM   Final diagnoses:  Pharyngitis  Sinus congestion  Uvulitis  Essential hypertension    5:13 PM BP 142/96 mmHg  Pulse 88  Temp(Src) 98.1 F (36.7 C) (Oral)  Resp 20  Ht 5\' 4"   (1.626 m)  Wt 104.327 kg  BMI 39.46 kg/m2  SpO2 99%  LMP 01/15/2013 Patient treated in the ED with decadron and toradol Given Decadron and Toradol in the ED for uvulitis. Patient be discharged with codeine cough your opinion naproxen sodium. We'll give her a work note for the next day or 2 off. Advised rest, hydration, warm liquids, salt water gargles. Discussed return precautions with the patient. Pierce safe for discharge at this time    Margarita Mail, PA-C 01/26/16 [place], MD 01/26/16 [phone]

## 2016-01-26 NOTE — Discharge Instructions (Signed)
Hypertension Hypertension, commonly called high blood pressure, is when the force of blood pumping through your arteries is too strong. Your arteries are the blood vessels that carry blood from your heart throughout your body. A blood pressure reading consists of a higher number over a lower number, such as 110/72. The higher number (systolic) is the pressure inside your arteries when your heart pumps. The lower number (diastolic) is the pressure inside your arteries when your heart relaxes. Ideally you want your blood pressure below 120/80. Hypertension forces your heart to work harder to pump blood. Your arteries may become narrow or stiff. Having untreated or uncontrolled hypertension can cause heart attack, stroke, kidney disease, and other problems. RISK FACTORS Some risk factors for high blood pressure are controllable. Others are not.  Risk factors you cannot control include:   Race. You may be at higher risk if you are African American.  Age. Risk increases with age.  Gender. Men are at higher risk than women before age 45 years. After age 65, women are at higher risk than men. Risk factors you can control include:  Not getting enough exercise or physical activity.  Being overweight.  Getting too much fat, sugar, calories, or salt in your diet.  Drinking too much alcohol. SIGNS AND SYMPTOMS Hypertension does not usually cause signs or symptoms. Extremely high blood pressure (hypertensive crisis) may cause headache, anxiety, shortness of breath, and nosebleed. DIAGNOSIS To check if you have hypertension, your health care provider will measure your blood pressure while you are seated, with your arm held at the level of your heart. It should be measured at least twice using the same arm. Certain conditions can cause a difference in blood pressure between your right and left arms. A blood pressure reading that is higher than normal on one occasion does not mean that you need treatment. If  it is not clear whether you have high blood pressure, you may be asked to return on a different day to have your blood pressure checked again. Or, you may be asked to monitor your blood pressure at home for 1 or more weeks. TREATMENT Treating high blood pressure includes making lifestyle changes and possibly taking medicine. Living a healthy lifestyle can help lower high blood pressure. You may need to change some of your habits. Lifestyle changes may include:  Following the DASH diet. This diet is high in fruits, vegetables, and whole grains. It is low in salt, red meat, and added sugars.  Keep your sodium intake below 2,300 mg per day.  Getting at least 30-45 minutes of aerobic exercise at least 4 times per week.  Losing weight if necessary.  Not smoking.  Limiting alcoholic beverages.  Learning ways to reduce stress. Your health care provider may prescribe medicine if lifestyle changes are not enough to get your blood pressure under control, and if one of the following is true:  You are 18-59 years of age and your systolic blood pressure is above 140.  You are 60 years of age or older, and your systolic blood pressure is above 150.  Your diastolic blood pressure is above 90.  You have diabetes, and your systolic blood pressure is over 140 or your diastolic blood pressure is over 90.  You have kidney disease and your blood pressure is above 140/90.  You have heart disease and your blood pressure is above 140/90. Your personal target blood pressure may vary depending on your medical conditions, your age, and other factors. HOME CARE INSTRUCTIONS    Have your blood pressure rechecked as directed by your health care provider.   Take medicines only as directed by your health care provider. Follow the directions carefully. Blood pressure medicines must be taken as prescribed. The medicine does not work as well when you skip doses. Skipping doses also puts you at risk for  problems.  Do not smoke.   Monitor your blood pressure at home as directed by your health care provider. SEEK MEDICAL CARE IF:   You think you are having a reaction to medicines taken.  You have recurrent headaches or feel dizzy.  You have swelling in your ankles.  You have trouble with your vision. SEEK IMMEDIATE MEDICAL CARE IF:  You develop a severe headache or confusion.  You have unusual weakness, numbness, or feel faint.  You have severe chest or abdominal pain.  You vomit repeatedly.  You have trouble breathing. MAKE SURE YOU:   Understand these instructions.  Will watch your condition.  Will get help right away if you are not doing well or get worse.   This information is not intended to replace advice given to you by your health care provider. Make sure you discuss any questions you have with your health care provider.   Document Released: 09/24/2005 Document Revised: 02/08/2015 Document Reviewed: 07/17/2013 Elsevier Interactive Patient Education 2016 [employer]  Uvulitis Uvulitis is infection or inflammation of the uvula. The uvula is the small, finger-like piece of tissue that hangs down at the back of your throat. CAUSES This condition may be caused by:  An infection in the mouth or throat. This is the most common cause.  Trauma to the uvula. Causes of trauma include burning your mouth and heavy snoring.  Fluid build-up (edema). Edema can be triggered be an allergic reaction. Uvulitis that is caused by edema is called Quincke disease.  Inhaling irritants, such as chemical agents, smoke, or steam. SYMPTOMS Symptoms of this condition depend on the cause.  Symptoms of uvulitis that is caused by infection include:  Red, swollen uvula.  Sore throat.  Fever.  Headache.  Swollen neck glands. Symptoms of uvulitis that is caused by trauma, edema, or irritation include:  Red, swollen uvula.  Sore throat.  Trouble swallowing.  Choking or  gagging.  Trouble breathing. DIAGNOSIS This condition is diagnosed with a physical exam. You also may have tests, such as a throat culture and blood tests. TREATMENT Treatment for this condition depends on the cause. Treatment may involve:  Antibiotic medicine. Antibiotics may be prescribed if a bacterial infection is the cause.  Steroid medicine. Steroids may be given if edema is the cause.  Surgery to remove part of the uvula (partial uvulectomy). HOME CARE INSTRUCTIONS  Rest as much as possible until your condition improves.  Drink enough fluid to keep your urine clear or pale yellow.  Take over-the-counter and prescription medicines only as told by your health care provider.  If you were prescribed an antibiotic medicine, take it as told by your health care provider. Do not stop taking the antibiotic even if you start to feel better.  Use a cool-mist humidifier to ease irritation in your throat.  While your throat is sore:  Eat soft foods or drink liquids, such as soup.  Gargle with a salt-water mixture 3-4 times per day or as needed. To make a salt-water mixture, completely dissolve -1 tsp of salt in 1 cup of warm water.  Keep all follow-up visits as told by your health care provider. This is  important. SEEK MEDICAL CARE IF:  You have a fever.  You have trouble eating.  Your symptoms do not get better.  Your symptoms come back after treatment. SEEK IMMEDIATE MEDICAL CARE IF:  You have trouble breathing.  You have trouble swallowing.   This information is not intended to replace advice given to you by your health care provider. Make sure you discuss any questions you have with your health care provider.   Document Released: 05/04/2004 Document Revised: 06/15/2015 Document Reviewed: 12/15/2014 Elsevier Interactive Patient Education 2016 Elsevier Inc.  Viral Infections A viral infection can be caused by different types of viruses.Most viral infections are not  serious and resolve on their own. However, some infections may cause severe symptoms and may lead to further complications. SYMPTOMS Viruses can frequently cause:  Minor sore throat.  Aches and pains.  Headaches.  Runny nose.  Different types of rashes.  Watery eyes.  Tiredness.  Cough.  Loss of appetite.  Gastrointestinal infections, resulting in nausea, vomiting, and diarrhea. These symptoms do not respond to antibiotics because the infection is not caused by bacteria. However, you might catch a bacterial infection following the viral infection. This is sometimes called a "superinfection." Symptoms of such a bacterial infection may include:  Worsening sore throat with pus and difficulty swallowing.  Swollen neck glands.  Chills and a high or persistent fever.  Severe headache.  Tenderness over the sinuses.  Persistent overall ill feeling (malaise), muscle aches, and tiredness (fatigue).  Persistent cough.  Yellow, green, or brown mucus production with coughing. HOME CARE INSTRUCTIONS   Only take over-the-counter or prescription medicines for pain, discomfort, diarrhea, or fever as directed by your caregiver.  Drink enough water and fluids to keep your urine clear or pale yellow. Sports drinks can provide valuable electrolytes, sugars, and hydration.  Get plenty of rest and maintain proper nutrition. Soups and broths with crackers or rice are fine. SEEK IMMEDIATE MEDICAL CARE IF:   You have severe headaches, shortness of breath, chest pain, neck pain, or an unusual rash.  You have uncontrolled vomiting, diarrhea, or you are unable to keep down fluids.  You or your child has an oral temperature above 102 F (38.9 C), not controlled by medicine.  Your baby is older than 3 months with a rectal temperature of 102 F (38.9 C) or higher.  Your baby is 54 months old or younger with a rectal temperature of 100.4 F (38 C) or higher. MAKE SURE YOU:   Understand  these instructions.  Will watch your condition.  Will get help right away if you are not doing well or get worse.   This information is not intended to replace advice given to you by your health care provider. Make sure you discuss any questions you have with your health care provider.   Document Released: 07/04/2005 Document Revised: 12/17/2011 Document Reviewed: 03/02/2015 Elsevier Interactive Patient Education [employer].

## 2016-01-26 NOTE — ED Notes (Signed)
Sore throat since last night.  

## 2016-01-29 LAB — CULTURE, GROUP A STREP (THRC)

## 2016-02-24 ENCOUNTER — Encounter (HOSPITAL_BASED_OUTPATIENT_CLINIC_OR_DEPARTMENT_OTHER): Payer: Self-pay | Admitting: *Deleted

## 2016-02-24 ENCOUNTER — Emergency Department (HOSPITAL_BASED_OUTPATIENT_CLINIC_OR_DEPARTMENT_OTHER)
Admission: EM | Admit: 2016-02-24 | Discharge: 2016-02-24 | Disposition: A | Discharge status: Home / Self Care | Payer: BLUE CROSS/BLUE SHIELD | Attending: Emergency Medicine | Admitting: Emergency Medicine

## 2016-02-24 ENCOUNTER — Emergency Department (HOSPITAL_BASED_OUTPATIENT_CLINIC_OR_DEPARTMENT_OTHER): Payer: BLUE CROSS/BLUE SHIELD

## 2016-02-24 DIAGNOSIS — R509 Fever, unspecified: Secondary | ICD-10-CM | POA: Diagnosis present

## 2016-02-24 DIAGNOSIS — Z791 Long term (current) use of non-steroidal anti-inflammatories (NSAID): Secondary | ICD-10-CM | POA: Diagnosis not present

## 2016-02-24 DIAGNOSIS — M791 Myalgia: Secondary | ICD-10-CM | POA: Diagnosis not present

## 2016-02-24 DIAGNOSIS — N390 Urinary tract infection, site not specified: Secondary | ICD-10-CM | POA: Diagnosis not present

## 2016-02-24 DIAGNOSIS — J029 Acute pharyngitis, unspecified: Secondary | ICD-10-CM | POA: Insufficient documentation

## 2016-02-24 DIAGNOSIS — R109 Unspecified abdominal pain: Secondary | ICD-10-CM | POA: Diagnosis not present

## 2016-02-24 DIAGNOSIS — R6889 Other general symptoms and signs: Secondary | ICD-10-CM

## 2016-02-24 LAB — URINALYSIS, ROUTINE W REFLEX MICROSCOPIC
BILIRUBIN URINE: NEGATIVE
GLUCOSE, UA: NEGATIVE mg/dL
Ketones, ur: NEGATIVE mg/dL
Nitrite: POSITIVE — AB
PH: 6 (ref 5.0–8.0)
Protein, ur: 100 mg/dL — AB
SPECIFIC GRAVITY, URINE: 1.013 (ref 1.005–1.030)

## 2016-02-24 LAB — COMPREHENSIVE METABOLIC PANEL
ALK PHOS: 82 U/L (ref 38–126)
ALT: 17 U/L (ref 14–54)
ANION GAP: 8 (ref 5–15)
AST: 18 U/L (ref 15–41)
Albumin: 3.2 g/dL — ABNORMAL LOW (ref 3.5–5.0)
BILIRUBIN TOTAL: 1 mg/dL (ref 0.3–1.2)
BUN: 12 mg/dL (ref 6–20)
CALCIUM: 9 mg/dL (ref 8.9–10.3)
CO2: 24 mmol/L (ref 22–32)
Chloride: 101 mmol/L (ref 101–111)
Creatinine, Ser: 0.88 mg/dL (ref 0.44–1.00)
GFR calc non Af Amer: >60 mL/min (ref >60)
Glucose, Bld: 107 mg/dL — ABNORMAL HIGH (ref 65–99)
Potassium: 3.3 mmol/L — ABNORMAL LOW (ref 3.5–5.1)
Sodium: 133 mmol/L — ABNORMAL LOW (ref 135–145)
TOTAL PROTEIN: 7.6 g/dL (ref 6.5–8.1)

## 2016-02-24 LAB — CBC WITH DIFFERENTIAL/PLATELET
Basophils Absolute: 0 10*3/uL (ref 0.0–0.1)
Basophils Relative: 0 %
EOS ABS: 0 10*3/uL (ref 0.0–0.7)
Eosinophils Relative: 0 %
HEMATOCRIT: 37.7 % (ref 36.0–46.0)
HEMOGLOBIN: 12.3 g/dL (ref 12.0–15.0)
LYMPHS ABS: 0.8 10*3/uL (ref 0.7–4.0)
Lymphocytes Relative: 5 %
MCH: 28.2 pg (ref 26.0–34.0)
MCHC: 32.6 g/dL (ref 30.0–36.0)
MCV: 86.5 fL (ref 78.0–100.0)
MONO ABS: 1.6 10*3/uL — AB (ref 0.1–1.0)
MONOS PCT: 10 %
NEUTROS PCT: 85 %
Neutro Abs: 14.3 10*3/uL — ABNORMAL HIGH (ref 1.7–7.7)
Platelets: 317 10*3/uL (ref 150–400)
RBC: 4.36 MIL/uL (ref 3.87–5.11)
RDW: 14.9 % (ref 11.5–15.5)
WBC: 16.7 10*3/uL — ABNORMAL HIGH (ref 4.0–10.5)

## 2016-02-24 LAB — URINE MICROSCOPIC-ADD ON

## 2016-02-24 LAB — LIPASE, BLOOD: LIPASE: 19 U/L (ref 11–51)

## 2016-02-24 LAB — RAPID STREP SCREEN (MED CTR MEBANE ONLY): Streptococcus, Group A Screen (Direct): NEGATIVE

## 2016-02-24 MED ORDER — IBUPROFEN 400 MG PO TABS
600.0000 mg | ORAL_TABLET | Freq: Once | ORAL | Status: AC
  Administered 2016-02-24: 600 mg via ORAL
  Filled 2016-02-24: qty 1

## 2016-02-24 MED ORDER — SODIUM CHLORIDE 0.9 % IV SOLN: INTRAVENOUS | Status: DC

## 2016-02-24 MED ORDER — DM-GUAIFENESIN ER 30-600 MG PO TB12: 1.0000 | ORAL_TABLET | Freq: Two times a day (BID) | ORAL | Status: DC

## 2016-02-24 MED ORDER — NAPROXEN 500 MG PO TABS: 500.0000 mg | ORAL_TABLET | Freq: Two times a day (BID) | ORAL | Status: DC

## 2016-02-24 MED ORDER — CEPHALEXIN 500 MG PO CAPS: 500.0000 mg | ORAL_CAPSULE | Freq: Four times a day (QID) | ORAL | Status: DC

## 2016-02-24 MED ORDER — SODIUM CHLORIDE 0.9 % IV BOLUS (SEPSIS)
1000.0000 mL | Freq: Once | INTRAVENOUS | Status: AC
  Administered 2016-02-24: 1000 mL via INTRAVENOUS

## 2016-02-24 MED ORDER — DEXTROSE 5 % IV SOLN
1.0000 g | Freq: Once | INTRAVENOUS | Status: AC
  Administered 2016-02-24: 1 g via INTRAVENOUS
  Filled 2016-02-24: qty 10

## 2016-02-24 NOTE — ED Notes (Addendum)
Fever, abdominal pain and chills x 3 days. She took Ibuprofen 200mg  2 hours ago. Tylenol held at triage due to allergy.

## 2016-02-24 NOTE — ED Notes (Signed)
Pt c/o fever and generalized body aches with cough, sore throat for two days with two episodes of emesis on Wednesday.  Pt has been able to hold down fluids since Thursday morning.

## 2016-02-24 NOTE — ED Provider Notes (Signed)
CSN: [id number]     Arrival date & time 02/24/16  1831 History  By signing my name below, I, [name], attest that this documentation has been prepared under the direction and in the presence of [name], MD.   Electronically Signed: Nicole [name], ED Scribe. 02/24/2016. 11:44 PM   Chief Complaint  Patient presents with  . Fever   Patient is a 50 y.o. female presenting with fever. The history is provided by the patient. No language interpreter was used.  Fever Temp source:  Subjective Severity:  Moderate Onset quality:  Gradual Duration:  2 days Timing:  Constant Progression:  Unchanged Chronicity:  New Relieved by:  Nothing Worsened by:  Nothing tried Ineffective treatments:  None tried Associated symptoms: chest pain, chills, congestion, cough, dysuria, headaches, myalgias, nausea, sore throat and vomiting   Associated symptoms: no diarrhea and no rash    HPI Comments: [name] is a 50 y.o. female who presents to the Emergency Department complaining of gradual onset, subjective fever and chills, ongoing for two days. Pt reports associated intermittent headaches, generalized body aches, dry cough, congestion, and sore throat. Pt had emesis x2 episodes on 02/22/2016 and x1 episode on 02/23/2016. Pt also complains of left sided abdominal pain and mild chest pain. She also notes mild dysuria. No other associated symptoms noted. She states chest pain is worse with deep breaths. No other worsening or alleviating factors noted. Pt denies diarrhea, rash, back pain, or any other pertinent symptoms.   Past Medical History  Diagnosis Date  . Family history of anesthesia complication   . Anemia   . Anxiety    Past Surgical History  Procedure Laterality Date  . Cesarean section    . Laparoscopic assisted vaginal hysterectomy N/A 02/03/2013    Procedure: LAPAROSCOPIC ASSISTED VAGINAL HYSTERECTOMY;  Surgeon: Margarette Asal, MD;  Location: [facility];  Service:  Gynecology;  Laterality: N/A;  . Abdominal hysterectomy     Family History  Problem Relation Age of Onset  . Diabetes Mother   . Cancer Father   . Diabetes Sister    Social History  Substance Use Topics  . Smoking status: Never Smoker   . Smokeless tobacco: Never Used  . Alcohol Use: Yes     Comment: occasional wine   OB History    No data available     Review of Systems  Constitutional: Positive for fever and chills.  HENT: Positive for congestion and sore throat.   Eyes: Negative for visual disturbance.  Respiratory: Positive for cough. Negative for shortness of breath.   Cardiovascular: Positive for chest pain. Negative for leg swelling.  Gastrointestinal: Positive for nausea, vomiting and abdominal pain. Negative for diarrhea.  Genitourinary: Positive for dysuria. Negative for hematuria.  Musculoskeletal: Positive for myalgias. Negative for back pain and neck stiffness.  Skin: Negative for rash.  Allergic/Immunologic: Negative for immunocompromised state.  Neurological: Positive for headaches.    Allergies  Percocet  Home Medications   Prior to Admission medications   Medication Sig Start Date End Date Taking? Authorizing Provider  ALPRAZolam Duanne Moron) 0.25 MG tablet Take 0.25 mg by mouth at bedtime as needed for sleep.    Historical Provider, MD  cephALEXin (KEFLEX) 500 MG capsule Take 1 capsule (500 mg total) by mouth 4 (four) times daily. 02/24/16   [name], MD  dextromethorphan-guaiFENesin (MUCINEX DM) 30-600 MG 12hr tablet Take 1 tablet by mouth 2 (two) times daily. 02/24/16   [name], MD  Ferrous Sulfate (IRON)  90 (18 FE) MG TABS Take by mouth.    Historical Provider, MD  guaiFENesin-codeine 100-10 MG/5ML syrup Take 5-10 mLs by mouth every 6 (six) hours as needed for cough. 01/26/16   Abigail Harris, PA-C  HYDROcodone-ibuprofen (VICOPROFEN) 7.5-200 MG per tablet Take 1 tablet by mouth every 6 (six) hours as needed for pain. 02/04/13   Molli Posey,  MD  naproxen (NAPROSYN) 375 MG tablet Take 1 tablet (375 mg total) by mouth 2 (two) times daily. 01/26/16   Margarita Mail, PA-C  naproxen (NAPROSYN) 500 MG tablet Take 1 tablet (500 mg total) by mouth 2 (two) times daily. 02/24/16   [name], MD   BP 154/80 mmHg  Pulse 118  Temp(Src) 101.6 F (38.7 C) (Oral)  Resp 22  Ht 5\' 3"  (1.6 m)  Wt 243 lb 3 oz (110.309 kg)  BMI 43.09 kg/m2  SpO2 98%  LMP 01/15/2013 Physical Exam  Constitutional: She is oriented to person, place, and time. She appears well-developed and well-nourished. No distress.  HENT:  Head: Normocephalic and atraumatic.  Mouth/Throat: Mucous membranes are not dry. No posterior oropharyngeal edema or posterior oropharyngeal erythema.  Eyes: Conjunctivae and EOM are normal. Pupils are equal, round, and reactive to light. No scleral icterus.  Neck: Normal range of motion.  Cardiovascular: Normal rate, regular rhythm and normal heart sounds.  Exam reveals no gallop.   No murmur heard. Pulmonary/Chest: Effort normal and breath sounds normal. No respiratory distress. She has no wheezes. She has no rales.  Abdominal: Soft. Bowel sounds are normal. She exhibits no distension. There is no tenderness. There is no rebound and no guarding.  Musculoskeletal: Normal range of motion. She exhibits no edema.  Neurological: She is alert and oriented to person, place, and time.  Skin: Skin is warm and dry.  Psychiatric: She has a normal mood and affect. Judgment normal.  Nursing note and vitals reviewed.   ED Course  Procedures (including critical care time) DIAGNOSTIC STUDIES: Oxygen Saturation is 98% on RA, normal by my interpretation.    COORDINATION OF CARE: 8:42 PM Discussed treatment plan with pt at bedside and pt agreed to plan.  Labs Review Labs Reviewed  URINALYSIS, ROUTINE W REFLEX MICROSCOPIC (NOT AT [facility]) - Abnormal; Notable for the following:    Color, Urine AMBER (*)    APPearance CLOUDY (*)    Hgb urine  dipstick MODERATE (*)    Protein, ur 100 (*)    Nitrite POSITIVE (*)    Leukocytes, UA LARGE (*)    All other components within normal limits  URINE MICROSCOPIC-ADD ON - Abnormal; Notable for the following:    Squamous Epithelial / LPF 0-5 (*)    Bacteria, UA MANY (*)    All other components within normal limits  CBC WITH DIFFERENTIAL/PLATELET - Abnormal; Notable for the following:    WBC 16.7 (*)    Neutro Abs 14.3 (*)    Monocytes Absolute 1.6 (*)    All other components within normal limits  COMPREHENSIVE METABOLIC PANEL - Abnormal; Notable for the following:    Sodium 133 (*)    Potassium 3.3 (*)    Glucose, Bld 107 (*)    Albumin 3.2 (*)    All other components within normal limits  RAPID STREP SCREEN (NOT AT [facility])  CULTURE, GROUP A STREP [facility])  URINE CULTURE  LIPASE, BLOOD   Results for orders placed or performed during the hospital encounter of 02/24/16  Rapid strep screen  Result Value Ref Range  Streptococcus, Group A Screen (Direct) NEGATIVE NEGATIVE  Urinalysis, Routine w reflex microscopic (not at [facility])  Result Value Ref Range   Color, Urine AMBER (A) YELLOW   APPearance CLOUDY (A) CLEAR   Specific Gravity, Urine 1.013 1.005 - 1.030   pH 6.0 5.0 - 8.0   Glucose, UA NEGATIVE NEGATIVE mg/dL   Hgb urine dipstick MODERATE (A) NEGATIVE   Bilirubin Urine NEGATIVE NEGATIVE   Ketones, ur NEGATIVE NEGATIVE mg/dL   Protein, ur 100 (A) NEGATIVE mg/dL   Nitrite POSITIVE (A) NEGATIVE   Leukocytes, UA LARGE (A) NEGATIVE  Urine microscopic-add on  Result Value Ref Range   Squamous Epithelial / LPF 0-5 (A) NONE SEEN   WBC, UA TOO NUMEROUS TO COUNT 0 - 5 WBC/hpf   RBC / HPF 6-30 0 - 5 RBC/hpf   Bacteria, UA MANY (A) NONE SEEN   Urine-Other MUCOUS PRESENT   CBC with Differential/Platelet  Result Value Ref Range   WBC 16.7 (H) 4.0 - 10.5 K/uL   RBC 4.36 3.87 - 5.11 MIL/uL   Hemoglobin 12.3 12.0 - 15.0 g/dL   HCT 37.7 36.0 - 46.0 %   MCV 86.5 78.0 - 100.0 fL   MCH  28.2 26.0 - 34.0 pg   MCHC 32.6 30.0 - 36.0 g/dL   RDW 14.9 11.5 - 15.5 %   Platelets 317 150 - 400 K/uL   Neutrophils Relative % 85 %   Neutro Abs 14.3 (H) 1.7 - 7.7 K/uL   Lymphocytes Relative 5 %   Lymphs Abs 0.8 0.7 - 4.0 K/uL   Monocytes Relative 10 %   Monocytes Absolute 1.6 (H) 0.1 - 1.0 K/uL   Eosinophils Relative 0 %   Eosinophils Absolute 0.0 0.0 - 0.7 K/uL   Basophils Relative 0 %   Basophils Absolute 0.0 0.0 - 0.1 K/uL  Comprehensive metabolic panel  Result Value Ref Range   Sodium 133 (L) 135 - 145 mmol/L   Potassium 3.3 (L) 3.5 - 5.1 mmol/L   Chloride 101 101 - 111 mmol/L   CO2 24 22 - 32 mmol/L   Glucose, Bld 107 (H) 65 - 99 mg/dL   BUN 12 6 - 20 mg/dL   Creatinine, Ser 0.88 0.44 - 1.00 mg/dL   Calcium 9.0 8.9 - 10.3 mg/dL   Total Protein 7.6 6.5 - 8.1 g/dL   Albumin 3.2 (L) 3.5 - 5.0 g/dL   AST 18 15 - 41 U/L   ALT 17 14 - 54 U/L   Alkaline Phosphatase 82 38 - 126 U/L   Total Bilirubin 1.0 0.3 - 1.2 mg/dL   GFR calc non Af Amer >60 >60 mL/min   GFR calc Af Amer >60 >60 mL/min   Anion gap 8 5 - 15  Lipase, blood  Result Value Ref Range   Lipase 19 11 - 51 U/L     Imaging Review Dg Chest 2 View  02/24/2016  CLINICAL DATA:  Fever, cough, weakness, fatigue EXAM: CHEST  2 VIEW COMPARISON:  None. FINDINGS: Lungs are clear.  No pleural effusion or pneumothorax. The heart is normal in size. Visualized osseous structures are within normal limits. IMPRESSION: Normal chest radiographs. Electronically Signed   By: Julian Hy M.D.   On: 02/24/2016 20:05   I have personally reviewed and evaluated these images and lab results as part of my medical decision-making.   EKG Interpretation None      MDM   Final diagnoses:  Flu-like symptoms  UTI (lower urinary tract infection)  Patient with 2 separate findings. One is flulike illness that started 3 days ago. First 2 days had some episodes of emesis. Complaints are bodyaches sore throat fever no diarrhea.  Cough congestion. Patient's workup also showed evidence of a urinary tract infection. Patient did have a complaint of dysuria. No CVA tenderness no flank tenderness no significant abdominal tenderness. Patient will be treated for flulike illness she is nontoxic no acute distress. No hypotension no tachycardia. Did have fever here to 101.6. With fluids patient feeling better. Temp currently now 98. Patient received 1 dose of Rocephin for the urinary tract infection on be continued on Keflex. Otherwise treatment be symptomatic with Naprosyn and Mucinex DM work note provided. Patient will return for any new or worse symptoms.   I personally performed the services described in this documentation, which was scribed in my presence. The recorded information has been reviewed and is accurate.      [name], MD 02/24/16 [phone]

## 2016-02-24 NOTE — Discharge Instructions (Signed)
A symptoms consistent with a flulike illness as well as urinary tract infection. Chest x-ray negative for pneumonia. Rapid strep test was negative. Increase fluids small amounts frequently. Bland diet as tolerated. Take the Naprosyn on a regular basis. Take the Keflex as directed for the next 7 days. Take Mucinex DM for cough and congestion. Return for any new or worse symptoms. Work note provided.

## 2016-02-24 NOTE — ED Notes (Signed)
Patient transported to X-ray 

## 2016-02-24 NOTE — ED Notes (Signed)
MD just updated pt, pt states she is ready to go home

## 2016-02-27 LAB — CULTURE, GROUP A STREP (THRC)

## 2016-02-28 LAB — URINE CULTURE

## 2016-02-29 ENCOUNTER — Telehealth (HOSPITAL_BASED_OUTPATIENT_CLINIC_OR_DEPARTMENT_OTHER): Payer: Self-pay | Admitting: Emergency Medicine

## 2016-02-29 NOTE — Telephone Encounter (Signed)
Post ED Visit - Positive Culture Follow-up  Culture report reviewed by antimicrobial stewardship pharmacist:  []  Elenor Quinones, Pharm.D. []  Heide Guile, Pharm.D., BCPS []  Parks Neptune, Pharm.D. []  Alycia Rossetti, Pharm.D., BCPS []  [place], [place].D., BCPS, AAHIVP []  Legrand Como, Pharm.D., BCPS, AAHIVP [x]  Milus Glazier, Pharm.D. []  Stephens November, Pharm.D.  Positive urine culture Treated with cephalexin, organism sensitive to the same and no further patient follow-up is required at this time.  Hazle Nordmann 02/29/2016, 9:29 AM

## 2016-10-11 ENCOUNTER — Emergency Department (HOSPITAL_BASED_OUTPATIENT_CLINIC_OR_DEPARTMENT_OTHER): Payer: BLUE CROSS/BLUE SHIELD

## 2016-10-11 ENCOUNTER — Encounter (HOSPITAL_BASED_OUTPATIENT_CLINIC_OR_DEPARTMENT_OTHER): Payer: Self-pay | Admitting: *Deleted

## 2016-10-11 ENCOUNTER — Emergency Department (HOSPITAL_BASED_OUTPATIENT_CLINIC_OR_DEPARTMENT_OTHER)
Admission: EM | Admit: 2016-10-11 | Discharge: 2016-10-11 | Disposition: A | Discharge status: Home / Self Care | Payer: BLUE CROSS/BLUE SHIELD | Attending: Emergency Medicine | Admitting: Emergency Medicine

## 2016-10-11 DIAGNOSIS — J189 Pneumonia, unspecified organism: Secondary | ICD-10-CM | POA: Diagnosis not present

## 2016-10-11 DIAGNOSIS — R05 Cough: Secondary | ICD-10-CM | POA: Diagnosis present

## 2016-10-11 DIAGNOSIS — Z79899 Other long term (current) drug therapy: Secondary | ICD-10-CM | POA: Insufficient documentation

## 2016-10-11 DIAGNOSIS — Z791 Long term (current) use of non-steroidal anti-inflammatories (NSAID): Secondary | ICD-10-CM | POA: Diagnosis not present

## 2016-10-11 LAB — CBC WITH DIFFERENTIAL/PLATELET
Basophils Absolute: 0 10*3/uL (ref 0.0–0.1)
Basophils Relative: 0 %
EOS PCT: 17 %
Eosinophils Absolute: 1.8 10*3/uL — ABNORMAL HIGH (ref 0.0–0.7)
HEMATOCRIT: 37.4 % (ref 36.0–46.0)
Hemoglobin: 12 g/dL (ref 12.0–15.0)
LYMPHS PCT: 12 %
Lymphs Abs: 1.3 10*3/uL (ref 0.7–4.0)
MCH: 27.5 pg (ref 26.0–34.0)
MCHC: 32.1 g/dL (ref 30.0–36.0)
MCV: 85.6 fL (ref 78.0–100.0)
MONOS PCT: 5 %
Monocytes Absolute: 0.5 10*3/uL (ref 0.1–1.0)
NEUTROS PCT: 66 %
Neutro Abs: 7 10*3/uL (ref 1.7–7.7)
Platelets: 367 10*3/uL (ref 150–400)
RBC: 4.37 MIL/uL (ref 3.87–5.11)
RDW: 14.5 % (ref 11.5–15.5)
WBC: 10.6 10*3/uL — AB (ref 4.0–10.5)

## 2016-10-11 LAB — BASIC METABOLIC PANEL
ANION GAP: 5 (ref 5–15)
BUN: 7 mg/dL (ref 6–20)
CHLORIDE: 107 mmol/L (ref 101–111)
CO2: 27 mmol/L (ref 22–32)
Calcium: 9 mg/dL (ref 8.9–10.3)
Creatinine, Ser: 0.71 mg/dL (ref 0.44–1.00)
Glucose, Bld: 123 mg/dL — ABNORMAL HIGH (ref 65–99)
POTASSIUM: 3.6 mmol/L (ref 3.5–5.1)
SODIUM: 139 mmol/L (ref 135–145)

## 2016-10-11 LAB — BRAIN NATRIURETIC PEPTIDE: B Natriuretic Peptide: 29.9 pg/mL (ref 0.0–100.0)

## 2016-10-11 MED ORDER — DOXYCYCLINE HYCLATE 100 MG PO CAPS: 100.0000 mg | ORAL_CAPSULE | Freq: Two times a day (BID) | ORAL | 0 refills | Status: AC

## 2016-10-11 MED FILL — DOXYCYCLINE HYC 100 MG CAP: 100 | 7 days supply | Qty: 14 | Fill #0

## 2016-10-11 NOTE — Discharge Instructions (Signed)
We believe that your symptoms are caused today by pneumonia, an infection in your lung(s).  Fortunately you should start to improve quickly after taking your antibiotics.  Please take the full course of antibiotics as prescribed and drink plenty of fluids.   ° °Follow up with your doctor within 1-2 days.  If you develop any new or worsening symptoms, including but not limited to fever in spite of taking over-the-counter ibuprofen and/or Tylenol, persistent vomiting, worsening shortness of breath, or other symptoms that concern you, please return to the Emergency Department immediately.  ° ° °Pneumonia °Pneumonia is an infection of the lungs.  °CAUSES °Pneumonia may be caused by bacteria or a virus. Usually, these infections are caused by breathing infectious particles into the lungs (respiratory tract). °SIGNS AND SYMPTOMS  °Cough. °Fever. °Chest pain. °Increased rate of breathing. °Wheezing. °Mucus production. °DIAGNOSIS  °If you have the common symptoms of pneumonia, your health care provider will typically confirm the diagnosis with a chest X-ray. The X-ray will show an abnormality in the lung (pulmonary infiltrate) if you have pneumonia. Other tests of your blood, urine, or sputum may be done to find the specific cause of your pneumonia. Your health care provider may also do tests (blood gases or pulse oximetry) to see how well your lungs are working. °TREATMENT  °Some forms of pneumonia may be spread to other people when you cough or sneeze. You may be asked to wear a mask before and during your exam. Pneumonia that is caused by bacteria is treated with antibiotic medicine. Pneumonia that is caused by the influenza virus may be treated with an antiviral medicine. Most other viral infections must run their course. These infections will not respond to antibiotics.  °HOME CARE INSTRUCTIONS  °Cough suppressants may be used if you are losing too much rest. However, coughing protects you by clearing your lungs. You  should avoid using cough suppressants if you can. °Your health care provider may have prescribed medicine if he or she thinks your pneumonia is caused by bacteria or influenza. Finish your medicine even if you start to feel better. °Your health care provider may also prescribe an expectorant. This loosens the mucus to be coughed up. °Take medicines only as directed by your health care provider. °Do not smoke. Smoking is a common cause of bronchitis and can contribute to pneumonia. If you are a smoker and continue to smoke, your cough may last several weeks after your pneumonia has cleared. °A cold steam vaporizer or humidifier in your room or home may help loosen mucus. °Coughing is often worse at night. Sleeping in a semi-upright position in a recliner or using a couple pillows under your head will help with this. °Get rest as you feel it is needed. Your body will usually let you know when you need to rest. °PREVENTION °A pneumococcal shot (vaccine) is available to prevent a common bacterial cause of pneumonia. This is usually suggested for: °People over 65 years old. °Patients on chemotherapy. °People with chronic lung problems, such as bronchitis or emphysema. °People with immune system problems. °If you are over 65 or have a high risk condition, you may receive the pneumococcal vaccine if you have not received it before. In some countries, a routine influenza vaccine is also recommended. This vaccine can help prevent some cases of pneumonia. You may be offered the influenza vaccine as part of your care. °If you smoke, it is time to quit. You may receive instructions on how to stop smoking. Your   health care provider can provide medicines and counseling to help you quit. °SEEK MEDICAL CARE IF: °You have a fever. °SEEK IMMEDIATE MEDICAL CARE IF:  °Your illness becomes worse. This is especially true if you are elderly or weakened from any other disease. °You cannot control your cough with suppressants and are losing  sleep. °You begin coughing up blood. °You develop pain which is getting worse or is uncontrolled with medicines. °Any of the symptoms which initially brought you in for treatment are getting worse rather than better. °You develop shortness of breath or chest pain. °MAKE SURE YOU:  °Understand these instructions. °Will watch your condition. °Will get help right away if you are not doing well or get worse. °Document Released: 09/24/2005 Document Revised: 02/08/2014 Document Reviewed: 12/14/2010 °[employer]® Patient Information ©2015 [employer]. This information is not intended to replace advice given to you by your health care provider. Make sure you discuss any questions you have with your health care provider. ° ° ° °

## 2016-10-11 NOTE — ED Notes (Signed)
Patient transported to X-ray 

## 2016-10-11 NOTE — ED Notes (Signed)
ED Provider at bedside. At end of triage pt reports increased SOB with ambulation and lower extremity swelling. Dr. Laverta Baltimore updated

## 2016-10-11 NOTE — ED Triage Notes (Signed)
Cough, sore throat, wheezing, bilateral ear pain for several days. Denies fever

## 2016-10-11 NOTE — ED Provider Notes (Signed)
Emergency Department Provider Note   I have reviewed the triage vital signs and the nursing notes.   HISTORY  Chief Complaint Sore Throat and Cough   HPI [name] is a 51 y.o. female with past medical history of anemia and anxiety presents to the emergency department for evaluation of cough, earache, sore throat, body aches for the last 5 days. Patient has not noticed any fever. There is a grandson at home who is recovering from similar illness. Patient denies any vomiting or diarrhea. She is unable to eat and drink but has increased fatigue. She is tried taking over-the-counter medications with minimal relief in symptoms. She describes her cough is slightly productive with no blood visible. She denies any chest pain. She has a mild intermittent headache. Cough and respiratory symptoms made worse with ambulation.  The patient also notes some lower extremity edema. She states that she's had swelling in both legs for about the last year but that is to be getting slightly worse. She has a gynecologist but does not see a primary care physician regularly. She recalls being told the past that the swelling is due to arthritis and denies any knowledge of kidney or heart failure diagnoses.   Past Medical History:  Diagnosis Date  . Anemia   . Anxiety   . Family history of anesthesia complication     Patient Active Problem List   Diagnosis Date Noted  . Leiomyoma 02/04/2013    Past Surgical History:  Procedure Laterality Date  . ABDOMINAL HYSTERECTOMY    . CESAREAN SECTION    . LAPAROSCOPIC ASSISTED VAGINAL HYSTERECTOMY N/A 02/03/2013   Procedure: LAPAROSCOPIC ASSISTED VAGINAL HYSTERECTOMY;  Surgeon: Margarette Asal, MD;  Location: [facility];  Service: Gynecology;  Laterality: N/A;    Current Outpatient Rx  . Order #: [id number] Class: Historical Med  . Order #: [id number] Class: Print  . Order #: [id number] Class: Print  . Order #: [id number] Class: Print  . Order #: [id number] Class:  Historical Med  . Order #: [id number] Class: Print  . Order #: [id number] Class: Print  . Order #: [id number] Class: Print  . Order #: [id number] Class: Print    Allergies Percocet [oxycodone-acetaminophen]  Family History  Problem Relation Age of Onset  . Diabetes Mother   . Cancer Father   . Diabetes Sister     Social History Social History  Substance Use Topics  . Smoking status: Never Smoker  . Smokeless tobacco: Never Used  . Alcohol use Yes     Comment: occasional wine    Review of Systems  Constitutional: No fever/chills. Positive fatigue and muscle aches.  Eyes: No visual changes. ENT: Positive sore throat. Cardiovascular: Denies chest pain. Respiratory: Denies shortness of breath. Positive cough.  Gastrointestinal: No abdominal pain.  No nausea, no vomiting.  No diarrhea.  No constipation. Genitourinary: Negative for dysuria. Musculoskeletal: Negative for back pain. Skin: Negative for rash. Neurological: Negative for headaches, focal weakness or numbness.  10-point ROS otherwise negative.  ____________________________________________   PHYSICAL EXAM:  VITAL SIGNS: ED Triage Vitals  Enc Vitals Group     BP 10/11/16 0834 151/95     Pulse Rate 10/11/16 0834 100     Resp 10/11/16 0834 20     Temp 10/11/16 0834 98.3 F (36.8 C)     Temp Source 10/11/16 0834 Oral     SpO2 10/11/16 0834 98 %     Pain Score 10/11/16 0832 8   Constitutional: Alert and oriented. Well appearing and in no acute distress. Eyes:  Conjunctivae are normal.  Head: Atraumatic. Ears:  Healthy appearing ear canals and TMs bilaterally Nose: No congestion/rhinnorhea. Mouth/Throat: Mucous membranes are moist.  Oropharynx with trace erythema. No PTA. No tonsillar hypertrophy or exudate.  Neck: No stridor.   Cardiovascular: Tachycardia. Good peripheral circulation. Grossly normal heart sounds.   Respiratory: Normal respiratory effort.  No retractions. Lungs CTAB. Gastrointestinal: Soft and  nontender. No distention.  Musculoskeletal: No lower extremity tenderness. Bilateral 1+ pitting edema in B/L LE. No gross deformities of extremities. Neurologic:  Normal speech and language. No gross focal neurologic deficits are appreciated.  Skin:  Skin is warm, dry and intact. No rash noted.  ____________________________________________   LABS (all labs ordered are listed, but only abnormal results are displayed)  Labs Reviewed  BASIC METABOLIC PANEL - Abnormal; Notable for the following:       Result Value   Glucose, Bld 123 (*)    All other components within normal limits  CBC WITH DIFFERENTIAL/PLATELET - Abnormal; Notable for the following:    WBC 10.6 (*)    Eosinophils Absolute 1.8 (*)    All other components within normal limits  BRAIN NATRIURETIC PEPTIDE   ____________________________________________  RADIOLOGY  Dg Chest 2 View  Result Date: 10/11/2016 CLINICAL DATA:  Cough, chest congestion, wheezing, shortness of breath, and body aches for the past 5 days. EXAM: CHEST  2 VIEW COMPARISON:  Chest x-ray of Feb 24, 2016 FINDINGS: The lungs are adequately inflated. There are patchy alveolar opacities in the lingula. The heart and pulmonary vascularity are normal. The mediastinum is normal in width. There is no pleural effusion. IMPRESSION: Lingular pneumonia. Followup PA and lateral chest X-ray is recommended in 3-4 weeks following trial of antibiotic therapy to ensure resolution. Electronically Signed   By: David  Martinique M.D.   On: 10/11/2016 09:00    ____________________________________________   PROCEDURES  Procedure(s) performed:   Procedures  None ____________________________________________   INITIAL IMPRESSION / ASSESSMENT AND PLAN / ED COURSE  Pertinent labs & imaging results that were available during my care of the patient were reviewed by me and considered in my medical decision making (see chart for details).  Patient resents to the emergency  department for evaluation of URI symptoms and lower extremity edema. She has some worsening risk for symptoms with exertion. Clinically she seems to have a viral upper history tract infection and possibly influenza. The patient is on day 5 of illness and would not benefit from Tamiflu. She has 1+ pitting edema bilateral lower extremities with ongoing for the last year. Given her respiratory symptoms and worsening symptoms with exertion of plan for chest x-ray, labs as is renal function, and BNP.   Patient with lingular pneumonia on chest x-ray. Plan for 1 week of doxycycline and primary care physician follow-up. No sign of systemic infection. No hypoxemia. Patient seems appropriate for outpatient management.  At this time, I do not feel there is any life-threatening condition present. I have reviewed and discussed all results (EKG, imaging, lab, urine as appropriate), exam findings with patient. I have reviewed nursing notes and appropriate previous records.  I feel the patient is safe to be discharged home without further emergent workup. Discussed usual and customary return precautions. Patient and family (if present) verbalize understanding and are comfortable with this plan.  Patient will follow-up with their primary care provider. If they do not have a primary care provider, information for follow-up has been provided to them. All questions have been answered.  ____________________________________________  FINAL  CLINICAL IMPRESSION(S) / ED DIAGNOSES  Final diagnoses:  Community acquired pneumonia, unspecified laterality     MEDICATIONS GIVEN DURING THIS VISIT:  None  NEW OUTPATIENT MEDICATIONS STARTED DURING THIS VISIT:  New Prescriptions   DOXYCYCLINE (VIBRAMYCIN) 100 MG CAPSULE    Take 1 capsule (100 mg total) by mouth 2 (two) times daily.      Note:  This document was prepared using Dragon voice recognition software and may include unintentional dictation errors.  Nanda Quinton,  MD Emergency Medicine   Margette Fast, MD 10/11/16 1016

## 2016-10-17 ENCOUNTER — Encounter (HOSPITAL_BASED_OUTPATIENT_CLINIC_OR_DEPARTMENT_OTHER): Payer: Self-pay | Admitting: *Deleted

## 2016-10-17 ENCOUNTER — Emergency Department (HOSPITAL_BASED_OUTPATIENT_CLINIC_OR_DEPARTMENT_OTHER)
Admission: EM | Admit: 2016-10-17 | Discharge: 2016-10-17 | Disposition: A | Discharge status: Home / Self Care | Payer: BLUE CROSS/BLUE SHIELD | Attending: Emergency Medicine | Admitting: Emergency Medicine

## 2016-10-17 ENCOUNTER — Emergency Department (HOSPITAL_BASED_OUTPATIENT_CLINIC_OR_DEPARTMENT_OTHER): Payer: BLUE CROSS/BLUE SHIELD

## 2016-10-17 DIAGNOSIS — J189 Pneumonia, unspecified organism: Secondary | ICD-10-CM

## 2016-10-17 DIAGNOSIS — J181 Lobar pneumonia, unspecified organism: Secondary | ICD-10-CM | POA: Insufficient documentation

## 2016-10-17 NOTE — ED Notes (Signed)
Pt given note for work.

## 2016-10-17 NOTE — ED Provider Notes (Signed)
[facility] Provider Note   CSN: [id number] Arrival date & time: 10/17/16  [id number]     History   Chief Complaint Chief Complaint  Patient presents with  . Pneumonia    HPI [name] is a 51 y.o. female.  Pt presents to the ED today for a recheck of her pneumonia.  The pt was here on 1/4 and was diagnosed with a left lingular pna.  She was put on antibiotics, and was told to f/u with pcp in 1 week.  She does not have a pcp, so she came here.  The pt said she is feeling much better.      Past Medical History:  Diagnosis Date  . Anemia   . Anxiety   . Family history of anesthesia complication     Patient Active Problem List   Diagnosis Date Noted  . Leiomyoma 02/04/2013    Past Surgical History:  Procedure Laterality Date  . ABDOMINAL HYSTERECTOMY    . CESAREAN SECTION    . LAPAROSCOPIC ASSISTED VAGINAL HYSTERECTOMY N/A 02/03/2013   Procedure: LAPAROSCOPIC ASSISTED VAGINAL HYSTERECTOMY;  Surgeon: Margarette Asal, MD;  Location: [facility];  Service: Gynecology;  Laterality: N/A;    OB History    No data available       Home Medications    Prior to Admission medications   Medication Sig Start Date End Date Taking? Authorizing Provider  ALPRAZolam Duanne Moron) 0.25 MG tablet Take 0.25 mg by mouth at bedtime as needed for sleep.    Historical Provider, MD  cephALEXin (KEFLEX) 500 MG capsule Take 1 capsule (500 mg total) by mouth 4 (four) times daily. 02/24/16   [name], MD  dextromethorphan-guaiFENesin (MUCINEX DM) 30-600 MG 12hr tablet Take 1 tablet by mouth 2 (two) times daily. 02/24/16   [name], MD  doxycycline (VIBRAMYCIN) 100 MG capsule Take 1 capsule (100 mg total) by mouth 2 (two) times daily. 10/11/16 10/18/16  Margette Fast, MD  Ferrous Sulfate (IRON) 90 (18 FE) MG TABS Take by mouth.    Historical Provider, MD  guaiFENesin-codeine 100-10 MG/5ML syrup Take 5-10 mLs by mouth every 6 (six) hours as needed for cough. 01/26/16   Abigail  Harris, PA-C  HYDROcodone-ibuprofen (VICOPROFEN) 7.5-200 MG per tablet Take 1 tablet by mouth every 6 (six) hours as needed for pain. 02/04/13   Molli Posey, MD  naproxen (NAPROSYN) 375 MG tablet Take 1 tablet (375 mg total) by mouth 2 (two) times daily. 01/26/16   Margarita Mail, PA-C  naproxen (NAPROSYN) 500 MG tablet Take 1 tablet (500 mg total) by mouth 2 (two) times daily. 02/24/16   [name], MD    Family History Family History  Problem Relation Age of Onset  . Diabetes Mother   . Cancer Father   . Diabetes Sister     Social History Social History  Substance Use Topics  . Smoking status: Never Smoker  . Smokeless tobacco: Never Used  . Alcohol use Yes     Comment: occasional wine     Allergies   Percocet [oxycodone-acetaminophen]   Review of Systems Review of Systems  Respiratory: Positive for cough.   All other systems reviewed and are negative.    Physical Exam Updated Vital Signs BP 164/92 (BP Location: Left Arm)   Pulse 84   Temp 97.8 F (36.6 C) (Oral)   Resp 18   Ht 5\' 4"  (1.626 m)   Wt 235 lb 9.6 oz (106.9 kg)   LMP 01/15/2013  SpO2 98%   BMI 40.44 kg/m   Physical Exam  Constitutional: She is oriented to person, place, and time. She appears well-developed and well-nourished.  HENT:  Head: Normocephalic and atraumatic.  Right Ear: External ear normal.  Left Ear: External ear normal.  Nose: Nose normal.  Mouth/Throat: Oropharynx is clear and moist.  Eyes: Conjunctivae and EOM are normal. Pupils are equal, round, and reactive to light.  Neck: Normal range of motion. Neck supple.  Cardiovascular: Normal rate, regular rhythm, normal heart sounds and intact distal pulses.   Pulmonary/Chest: Effort normal and breath sounds normal.  Abdominal: Soft. Bowel sounds are normal.  Musculoskeletal: Normal range of motion.  Neurological: She is alert and oriented to person, place, and time.  Skin: Skin is warm.  Psychiatric: She has a normal mood  and affect. Her behavior is normal. Judgment and thought content normal.  Nursing note and vitals reviewed.    ED Treatments / Results  Labs (all labs ordered are listed, but only abnormal results are displayed) Labs Reviewed - No data to display  EKG  EKG Interpretation None       Radiology Dg Chest 2 View  Result Date: 10/17/2016 CLINICAL DATA:  Cough, history of pneumonia, followup EXAM: CHEST  2 VIEW COMPARISON:  Chest x-ray of 10/11/2016 FINDINGS: The lingular opacity noted previously has cleared. No pneumonia or effusion is seen. Mediastinal and hilar contours are unremarkable. The heart is within normal limits in size. No bony abnormality is seen. IMPRESSION: Clearing of lingular pneumonia.  No active process. Electronically Signed   By: Ivar Drape M.D.   On: 10/17/2016 09:56    Procedures Procedures (including critical care time)  Medications Ordered in ED Medications - No data to display   Initial Impression / Assessment and Plan / ED Course  I have reviewed the triage vital signs and the nursing notes.  Pertinent labs & imaging results that were available during my care of the patient were reviewed by me and considered in my medical decision making (see chart for details).  Clinical Course     Pneumonia has resolved.  Pt knows to return if worse.  She is given the number for [facility].  Final Clinical Impressions(s) / ED Diagnoses   Final diagnoses:  Community acquired pneumonia of left lower lobe of lung ([place])    New Prescriptions New Prescriptions   No medications on file     Isla Pence, MD 10/17/16 1024

## 2016-10-17 NOTE — Discharge Instructions (Signed)
Your pneumonia has resolved!

## 2016-10-17 NOTE — ED Triage Notes (Signed)
Patient seen on 10/11/16 and diagnosed with pneumonia & instructed to f/u after taking antibiotics

## 2016-11-25 ENCOUNTER — Encounter (HOSPITAL_BASED_OUTPATIENT_CLINIC_OR_DEPARTMENT_OTHER): Payer: Self-pay

## 2016-11-25 ENCOUNTER — Emergency Department (HOSPITAL_BASED_OUTPATIENT_CLINIC_OR_DEPARTMENT_OTHER)
Admission: EM | Admit: 2016-11-25 | Discharge: 2016-11-25 | Disposition: A | Discharge status: Home / Self Care | Payer: BLUE CROSS/BLUE SHIELD | Attending: Physician Assistant | Admitting: Physician Assistant

## 2016-11-25 DIAGNOSIS — T7840XA Allergy, unspecified, initial encounter: Secondary | ICD-10-CM | POA: Diagnosis not present

## 2016-11-25 DIAGNOSIS — Z79899 Other long term (current) drug therapy: Secondary | ICD-10-CM | POA: Diagnosis not present

## 2016-11-25 DIAGNOSIS — R220 Localized swelling, mass and lump, head: Secondary | ICD-10-CM | POA: Diagnosis present

## 2016-11-25 DIAGNOSIS — Z889 Allergy status to unspecified drugs, medicaments and biological substances status: Secondary | ICD-10-CM

## 2016-11-25 DIAGNOSIS — M79621 Pain in right upper arm: Secondary | ICD-10-CM | POA: Insufficient documentation

## 2016-11-25 DIAGNOSIS — K029 Dental caries, unspecified: Secondary | ICD-10-CM | POA: Insufficient documentation

## 2016-11-25 MED ORDER — DIPHENHYDRAMINE HCL 25 MG PO CAPS
50.0000 mg | ORAL_CAPSULE | Freq: Once | ORAL | Status: AC
  Administered 2016-11-25: 50 mg via ORAL
  Filled 2016-11-25: qty 2

## 2016-11-25 MED ORDER — PREDNISONE 10 MG PO TABS
60.0000 mg | ORAL_TABLET | Freq: Once | ORAL | Status: AC
  Administered 2016-11-25: 60 mg via ORAL
  Filled 2016-11-25: qty 1

## 2016-11-25 MED ORDER — DIPHENHYDRAMINE HCL 25 MG PO CAPS
ORAL_CAPSULE | ORAL | Status: AC
  Filled 2016-11-25: qty 1

## 2016-11-25 MED ORDER — DIPHENHYDRAMINE HCL 25 MG PO CAPS: 50.0000 mg | ORAL_CAPSULE | Freq: Four times a day (QID) | ORAL | 0 refills | Status: DC | PRN

## 2016-11-25 MED ORDER — IBUPROFEN 800 MG PO TABS: 800.0000 mg | ORAL_TABLET | Freq: Three times a day (TID) | ORAL | 0 refills | Status: DC

## 2016-11-25 MED ORDER — CYCLOBENZAPRINE HCL 10 MG PO TABS: 10.0000 mg | ORAL_TABLET | Freq: Two times a day (BID) | ORAL | 0 refills | Status: DC | PRN

## 2016-11-25 MED ORDER — PREDNISONE 10 MG PO TABS: 40.0000 mg | ORAL_TABLET | Freq: Every day | ORAL | 0 refills | Status: AC

## 2016-11-25 NOTE — Discharge Instructions (Signed)
We think it is likely your weave4 that is touching her face causing you to have allergy and swelling. However please make sure that you use Benadryl and prednisone to decrease the swelling. Please follow-up with her primary care physician. For your muscle pain in your right shoulder, use ibuprofen and Flexeril.

## 2016-11-25 NOTE — ED Triage Notes (Signed)
Pt reports lower facial swelling around mouth. Also sts right arm pain. Pt does not take HTN medications. Pt denies dental pain.

## 2016-11-25 NOTE — ED Provider Notes (Signed)
[facility] Provider Note   CSN: [id number] Arrival date & time: 11/25/16  1613   By signing my name below, I, Soijett Blue, attest that this documentation has been prepared under the direction and in the presence of Agnieszka Newhouse Lyn Mirelle Biskup, MD. Electronically Signed: Soijett Blue, ED Scribe. 11/25/16. 4:48 PM.  History   Chief Complaint Chief Complaint  Patient presents with  . Arm Pain  . Facial Swelling    HPI [name] is a 51 y.o. female who presents to the Emergency Department complaining of mildly resolved left lower facial swelling onset 1 PM yesterday. Pt hasn't tried any medications for the relief of her symptoms. Pt notes that she began to have facial swelling following having hair extensions placed. She denies dental pain, fever, nausea, vomiting, diarrhea, and any other symptoms.  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 Denies taking lisinopril, new lipsticks, face wash, consuming peanuts or shellfish.  Pt secondarily complains of right upper arm pain worsened with movement onset yesterday. Pt hasn't tried any medications for the relief of her symptoms. Pt denies swelling and any other symptoms.    The history is provided by the patient. No language interpreter was used.    Past Medical History:  Diagnosis Date  . Anemia   . Anxiety   . Family history of anesthesia complication     Patient Active Problem List   Diagnosis Date Noted  . Leiomyoma 02/04/2013    Past Surgical History:  Procedure Laterality Date  . ABDOMINAL HYSTERECTOMY    . CESAREAN SECTION    . LAPAROSCOPIC ASSISTED VAGINAL HYSTERECTOMY N/A 02/03/2013   Procedure: LAPAROSCOPIC ASSISTED VAGINAL HYSTERECTOMY;  Surgeon: Margarette Asal, MD;  Location: [facility];  Service: Gynecology;  Laterality: N/A;    OB History    No data available       Home Medications    Prior to Admission medications   Medication Sig Start Date End Date Taking? Authorizing Provider    ALPRAZolam Duanne Moron) 0.25 MG tablet Take 0.25 mg by mouth at bedtime as needed for sleep.    Historical Provider, MD  cephALEXin (KEFLEX) 500 MG capsule Take 1 capsule (500 mg total) by mouth 4 (four) times daily. 02/24/16   [name], MD  dextromethorphan-guaiFENesin (MUCINEX DM) 30-600 MG 12hr tablet Take 1 tablet by mouth 2 (two) times daily. 02/24/16   [name], MD  Ferrous Sulfate (IRON) 90 (18 FE) MG TABS Take by mouth.    Historical Provider, MD  guaiFENesin-codeine 100-10 MG/5ML syrup Take 5-10 mLs by mouth every 6 (six) hours as needed for cough. 01/26/16   Abigail Harris, PA-C  HYDROcodone-ibuprofen (VICOPROFEN) 7.5-200 MG per tablet Take 1 tablet by mouth every 6 (six) hours as needed for pain. 02/04/13   Molli Posey, MD  naproxen (NAPROSYN) 375 MG tablet Take 1 tablet (375 mg total) by mouth 2 (two) times daily. 01/26/16   Margarita Mail, PA-C  naproxen (NAPROSYN) 500 MG tablet Take 1 tablet (500 mg total) by mouth 2 (two) times daily. 02/24/16   [name], MD    Family History Family History  Problem Relation Age of Onset  . Diabetes Mother   . Cancer Father   . Diabetes Sister     Social History Social History  Substance Use Topics  . Smoking status: Never Smoker  . Smokeless tobacco: Never Used  . Alcohol use Yes     Comment: occasional wine     Allergies   Percocet [oxycodone-acetaminophen]  Review of Systems Review of Systems  Constitutional: Negative for fever.  HENT: Negative for dental problem.        +left lower facial swelling  Gastrointestinal: Negative for diarrhea, nausea and vomiting.  Musculoskeletal: Positive for myalgias (right upper arm). Negative for joint swelling.     Physical Exam Updated Vital Signs BP 152/91 (BP Location: Left Arm)   Pulse 100   Temp 98.2 F (36.8 C) (Oral)   Resp 18   Ht 5\' 4"  (1.626 m)   Wt 260 lb (117.9 kg)   LMP 01/15/2013   SpO2 100%   BMI 44.63 kg/m   Physical Exam  Constitutional:  She is oriented to person, place, and time. She appears well-developed and well-nourished. No distress.  HENT:  Head: Normocephalic and atraumatic.  Mouth/Throat: Uvula is midline, oropharynx is clear and moist and mucous membranes are normal. Abnormal dentition. Dental caries present. No dental abscesses.  Diffuse caries and broken teeth. No evidence of abscess or infection. Mild swelling of left lower lip and mild swelling to lateral aspect of corner of mouth.  Eyes: EOM are normal.  Neck: Neck supple.  Cardiovascular: Normal rate, regular rhythm and normal heart sounds.  Exam reveals no gallop and no friction rub.   No murmur heard. Pulmonary/Chest: Effort normal and breath sounds normal. No respiratory distress. She has no wheezes. She has no rales.  Abdominal: She exhibits no distension.  Musculoskeletal: Normal range of motion.       Right upper arm: She exhibits tenderness.  Good ROM but tenderness in muscle belly of right bicep.   Lymphadenopathy:  No adenopathy.  Neurological: She is alert and oriented to person, place, and time.  Skin: Skin is warm and dry.  Psychiatric: She has a normal mood and affect. Her behavior is normal.  Nursing note and vitals reviewed.    ED Treatments / Results  DIAGNOSTIC STUDIES: Oxygen Saturation is 100% on RA, nl by my interpretation.    COORDINATION OF CARE: 4:44 PM Discussed treatment plan with pt at bedside which includes benadryl, flexeril, ibuprofen, prednisone, and pt agreed to plan.   Procedures Procedures (including critical care time)  Medications Ordered in ED Medications  diphenhydrAMINE (BENADRYL) 25 mg capsule (  Not Given 11/25/16 1653)  diphenhydrAMINE (BENADRYL) capsule 50 mg (50 mg Oral Given 11/25/16 1652)  predniSONE (DELTASONE) tablet 60 mg (60 mg Oral Given 11/25/16 1652)     Initial Impression / Assessment and Plan / ED Course  I have reviewed the triage vital signs and the nursing notes.  I personally performed  the services described in this documentation, which was scribed in my presence. The recorded information has been reviewed and is accurate.   Patient is 51 year old female presenting with 2 main complaints. One complaint is that she has muscle soreness in her right bicep. 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 We will have her use Flexeril and ibuprofen to help with this.  The other complaint is that patient has had intermittent swelling to her lower aspect of her face. Patient had a little swellling to the right hand side yesterday it disappeared and then a little bit to the left hand side of her face yesterday and today. We went through series of questions regarding changes in lipstick, facial creams. However and it is determined that patient recently had a new weave placed and it had been touching her face. Patient realizes that this is likely the culprit as it started right after  it was placed. We will treat with prednisone and Benadryl. We'll have her keep her we've out of her face in order to avoid any Contact allergy again.  Final Clinical Impressions(s) / ED Diagnoses   Final diagnoses:  None    New Prescriptions New Prescriptions   No medications on file     Tylisa Alcivar Julio Alm, MD 11/25/16 1659

## 2016-11-26 MED FILL — IBUPROFEN 800 MG TABLET: 800 | 7 days supply | Qty: 21 | Fill #0

## 2016-11-26 MED FILL — BANOPHEN 25 MG CAPSULE: 25 | 13 days supply | Qty: 100 | Fill #0

## 2016-11-26 MED FILL — predniSONE 10 MG TABS: 10 | 4 days supply | Qty: 16 | Fill #0

## 2016-11-26 MED FILL — CYCLOBENZAPRINE 10 MG TAB: 10 | 5 days supply | Qty: 10 | Fill #0

## 2017-07-31 ENCOUNTER — Encounter (HOSPITAL_BASED_OUTPATIENT_CLINIC_OR_DEPARTMENT_OTHER): Payer: Self-pay

## 2017-07-31 ENCOUNTER — Emergency Department (HOSPITAL_BASED_OUTPATIENT_CLINIC_OR_DEPARTMENT_OTHER): Payer: BLUE CROSS/BLUE SHIELD

## 2017-07-31 ENCOUNTER — Emergency Department (HOSPITAL_BASED_OUTPATIENT_CLINIC_OR_DEPARTMENT_OTHER)
Admission: EM | Admit: 2017-07-31 | Discharge: 2017-07-31 | Disposition: A | Discharge status: Home / Self Care | Payer: BLUE CROSS/BLUE SHIELD | Attending: Emergency Medicine | Admitting: Emergency Medicine

## 2017-07-31 DIAGNOSIS — J069 Acute upper respiratory infection, unspecified: Secondary | ICD-10-CM | POA: Diagnosis not present

## 2017-07-31 DIAGNOSIS — B9789 Other viral agents as the cause of diseases classified elsewhere: Secondary | ICD-10-CM | POA: Insufficient documentation

## 2017-07-31 DIAGNOSIS — R52 Pain, unspecified: Secondary | ICD-10-CM | POA: Diagnosis present

## 2017-07-31 MED ORDER — PREDNISONE 20 MG PO TABS: 40.0000 mg | ORAL_TABLET | Freq: Every day | ORAL | 0 refills | Status: DC

## 2017-07-31 MED ORDER — BENZONATATE 100 MG PO CAPS: 100.0000 mg | ORAL_CAPSULE | Freq: Three times a day (TID) | ORAL | 0 refills | Status: DC

## 2017-07-31 MED ORDER — ALBUTEROL SULFATE HFA 108 (90 BASE) MCG/ACT IN AERS
2.0000 | INHALATION_SPRAY | Freq: Once | RESPIRATORY_TRACT | Status: AC
  Administered 2017-07-31: 2 via RESPIRATORY_TRACT
  Filled 2017-07-31: qty 6.7

## 2017-07-31 MED FILL — BENZONATATE 100 MG CAPSULE: 100 | 7 days supply | Qty: 21 | Fill #0

## 2017-07-31 MED FILL — predniSONE 20 MG TABS: 20 | 5 days supply | Qty: 10 | Fill #0

## 2017-07-31 NOTE — ED Provider Notes (Signed)
[facility] EMERGENCY DEPARTMENT Provider Note   CSN: [id number] Arrival date & time: 07/31/17  1543     History   Chief Complaint Chief Complaint  Patient presents with  . Generalized Body Aches    HPI [name] is a 51 y.o. female.  HPI [name] is a 51 y.o. female's with a history of anxiety, anemia, presents to emergency department complaining of flulike symptoms.  Patient states her symptoms began 5 days ago.  States it began with diarrhea and some nasal congestion.  She states now she has cough, shortness of breath, wheezing at nighttime.  She states her cough is dry and nonproductive.  She states that she has noticed that she is more short of breath on exertion.  She is still having nasal congestion.  Reports postnasal drainage.  Denies any nausea or vomiting.  Denies any chest pain, no abdominal pain.  Denies any fever, but has experienced some chills.  She has been taking Tylenol for her symptoms with no improvement.  She has also tried cough drops for cough.  She states she had pneumonia last year around the states she wanted to make sure that this is not pneumonia.  Patient has not had her flu vaccination this year yet.  No sick contacts, but works for [employer].  Past Medical History:  Diagnosis Date  . Anemia   . Anxiety   . Family history of anesthesia complication     Patient Active Problem List   Diagnosis Date Noted  . Leiomyoma 02/04/2013    Past Surgical History:  Procedure Laterality Date  . ABDOMINAL HYSTERECTOMY    . CESAREAN SECTION    . LAPAROSCOPIC ASSISTED VAGINAL HYSTERECTOMY N/A 02/03/2013   Procedure: LAPAROSCOPIC ASSISTED VAGINAL HYSTERECTOMY;  Surgeon: Margarette Asal, MD;  Location: [facility];  Service: Gynecology;  Laterality: N/A;    OB History    No data available       Home Medications    Prior to Admission medications   Not on File    Family History Family History  Problem Relation Age of Onset  .  Diabetes Mother   . Cancer Father   . Diabetes Sister     Social History Social History  Substance Use Topics  . Smoking status: Never Smoker  . Smokeless tobacco: Never Used  . Alcohol use No     Allergies   Percocet [oxycodone-acetaminophen]   Review of Systems Review of Systems  Constitutional: Positive for chills. Negative for fever.  HENT: Positive for congestion and postnasal drip. Negative for sore throat, trouble swallowing and voice change.   Respiratory: Positive for cough, shortness of breath and wheezing. Negative for chest tightness.   Cardiovascular: Negative for chest pain, palpitations and leg swelling.  Gastrointestinal: Negative for abdominal pain, diarrhea, nausea and vomiting.  Genitourinary: Negative for dysuria, flank pain and pelvic pain.  Musculoskeletal: Negative for arthralgias, myalgias, neck pain and neck stiffness.  Skin: Negative for rash.  Neurological: Negative for dizziness, weakness and headaches.  All other systems reviewed and are negative.    Physical Exam Updated Vital Signs BP (!) 153/65 (BP Location: Left Arm)   Pulse 98   Temp 98.2 F (36.8 C) (Oral)   Resp 20   Ht 5\' 4"  (1.626 m)   Wt 113.4 kg (250 lb)   LMP 01/15/2013   SpO2 98%   BMI 42.91 kg/m   Physical Exam  Constitutional: She is oriented to person, place, and time. She appears  well-developed and well-nourished. No distress.  HENT:  Head: Normocephalic and atraumatic.  Right Ear: Tympanic membrane, external ear and ear canal normal.  Left Ear: Tympanic membrane, external ear and ear canal normal.  Nose: Mucosal edema and rhinorrhea present.  Mouth/Throat: Uvula is midline and mucous membranes are normal. Posterior oropharyngeal erythema present. No oropharyngeal exudate, posterior oropharyngeal edema or tonsillar abscesses.  Eyes: Conjunctivae are normal.  Neck: Neck supple.  Cardiovascular: Normal rate, regular rhythm, normal heart sounds and intact distal  pulses.   Pulmonary/Chest: Effort normal and breath sounds normal. No respiratory distress. She has no wheezes. She has no rales.  Abdominal: Soft. Bowel sounds are normal. She exhibits no distension. There is no tenderness. There is no rebound.  Musculoskeletal: Normal range of motion. She exhibits no edema.  Neurological: She is alert and oriented to person, place, and time.  Skin: Skin is warm and dry.  Psychiatric: She has a normal mood and affect. Her behavior is normal.  Nursing note and vitals reviewed.    ED Treatments / Results  Labs (all labs ordered are listed, but only abnormal results are displayed) Labs Reviewed - No data to display  EKG  EKG Interpretation None       Radiology Dg Chest 2 View  Result Date: 07/31/2017 CLINICAL DATA:  Shortness of breath EXAM: CHEST  2 VIEW COMPARISON:  10/17/2016 FINDINGS: The heart size and mediastinal contours are within normal limits. Both lungs are clear. The visualized skeletal structures are unremarkable. IMPRESSION: No active cardiopulmonary disease. Electronically Signed   By: Kathreen Devoid   On: 07/31/2017 16:53    Procedures Procedures (including critical care time)  Medications Ordered in ED Medications - No data to display   Initial Impression / Assessment and Plan / ED Course  I have reviewed the triage vital signs and the nursing notes.  Pertinent labs & imaging results that were available during my care of the patient were reviewed by me and considered in my medical decision making (see chart for details).     Patient in emergency department with upper respiratory infection symptoms.  Her vital signs are normal, she is not hypoxic, not tachycardic, not tachypneic.  She is not having any chest pain.  She having cough and nasal drainage.  Reports some body aches.  No fever here.  We will get a chest x-ray to rule out pneumonia given history of the same. Lungs clear on exam.   5:09 PM Chest xray negative. Most  likely viral bronchitis/URI. Will treat with 5 day steroids, inhaler, tessalon. Follow up with pcp in 2-3 days. If worsening follow up or return for recheck. Pt agrees to the plan.   Vitals:   07/31/17 1550  BP: (!) 153/65  Pulse: 98  Resp: 20  Temp: 98.2 F (36.8 C)  SpO2: 98%     Final Clinical Impressions(s) / ED Diagnoses   Final diagnoses:  Viral upper respiratory tract infection    New Prescriptions New Prescriptions   No medications on file     Jeannett Senior, PA-C 07/31/17 1712    Tegeler, Gwenyth Allegra, MD 07/31/17 2005

## 2017-07-31 NOTE — ED Notes (Signed)
ED Provider at bedside discussing test results and dispo plan of care. 

## 2017-07-31 NOTE — ED Notes (Signed)
Patient transported to X-ray 

## 2017-07-31 NOTE — Discharge Instructions (Signed)
Increased oral fluids. Rest. Take inhaler 2 puffs every 4 hrs for shortness of breath and wheezing. Take tessalon for cough as prescribed as needed. Prednisone for inflammation until all gone. Follow up in 2-3 days. Return if worsening.

## 2017-07-31 NOTE — ED Triage Notes (Signed)
C/o body aches, chills x 3-4 days-NAD-steady gait

## 2017-07-31 NOTE — ED Notes (Signed)
ED Provider at bedside. 

## 2017-09-26 ENCOUNTER — Encounter (HOSPITAL_BASED_OUTPATIENT_CLINIC_OR_DEPARTMENT_OTHER): Payer: Self-pay | Admitting: *Deleted

## 2017-09-26 ENCOUNTER — Other Ambulatory Visit: Payer: Self-pay

## 2017-09-26 ENCOUNTER — Emergency Department (HOSPITAL_BASED_OUTPATIENT_CLINIC_OR_DEPARTMENT_OTHER)
Admission: EM | Admit: 2017-09-26 | Discharge: 2017-09-26 | Disposition: A | Discharge status: Home / Self Care | Payer: BLUE CROSS/BLUE SHIELD | Attending: Emergency Medicine | Admitting: Emergency Medicine

## 2017-09-26 DIAGNOSIS — S29012A Strain of muscle and tendon of back wall of thorax, initial encounter: Secondary | ICD-10-CM | POA: Diagnosis not present

## 2017-09-26 DIAGNOSIS — S299XXA Unspecified injury of thorax, initial encounter: Secondary | ICD-10-CM | POA: Diagnosis present

## 2017-09-26 DIAGNOSIS — Y9389 Activity, other specified: Secondary | ICD-10-CM | POA: Diagnosis not present

## 2017-09-26 DIAGNOSIS — X58XXXA Exposure to other specified factors, initial encounter: Secondary | ICD-10-CM | POA: Insufficient documentation

## 2017-09-26 DIAGNOSIS — Y999 Unspecified external cause status: Secondary | ICD-10-CM | POA: Diagnosis not present

## 2017-09-26 DIAGNOSIS — Y929 Unspecified place or not applicable: Secondary | ICD-10-CM | POA: Diagnosis not present

## 2017-09-26 LAB — URINALYSIS, ROUTINE W REFLEX MICROSCOPIC
BILIRUBIN URINE: NEGATIVE
Glucose, UA: NEGATIVE mg/dL
Hgb urine dipstick: NEGATIVE
Ketones, ur: NEGATIVE mg/dL
LEUKOCYTES UA: NEGATIVE
NITRITE: NEGATIVE
Protein, ur: NEGATIVE mg/dL
pH: 6 (ref 5.0–8.0)

## 2017-09-26 MED ORDER — CAMPHOR-MENTHOL-METHYL SAL 1.2-5.7-6.3 % EX PTCH: MEDICATED_PATCH | CUTANEOUS | 0 refills | Status: DC

## 2017-09-26 MED ORDER — KETOROLAC TROMETHAMINE 60 MG/2ML IM SOLN
30.0000 mg | Freq: Once | INTRAMUSCULAR | Status: AC
  Administered 2017-09-26: 30 mg via INTRAMUSCULAR
  Filled 2017-09-26: qty 2

## 2017-09-26 MED ORDER — TRAMADOL HCL 50 MG PO TABS: 50.0000 mg | ORAL_TABLET | Freq: Four times a day (QID) | ORAL | 0 refills | Status: DC | PRN

## 2017-09-26 MED FILL — traMADol HCL 50 MG TABS: 50 | 2 days supply | Qty: 11 | Fill #0

## 2017-09-26 MED FILL — SALONPAS GEL-PATCH HOT: 0.025-1.25 | 6 days supply | Qty: 6 | Fill #0

## 2017-09-26 NOTE — ED Provider Notes (Signed)
[facility] EMERGENCY DEPARTMENT Provider Note   CSN: [id number] Arrival date & time: 09/26/17  1412     History   Chief Complaint Chief Complaint  Patient presents with  . Back Pain    HPI   Blood pressure (!) 156/90, pulse 93, temperature 97.7 F (36.5 C), temperature source Oral, resp. rate 18, height 5\' 4"  (1.626 m), weight 113.4 kg (250 lb), last menstrual period 01/15/2013, SpO2 99 %.  [name] is a 51 y.o. female complaining of left-sided thoracic back pain to worsening over the last several days she states is exacerbated by movement and certain positions.  She was vomiting and thinks that she may have pulled a muscle last week.  She is taken over-the-counter pain medication with incomplete relief.  She denies any cough, chest pain, shortness of breath.  Past Medical History:  Diagnosis Date  . Anemia   . Anxiety   . Family history of anesthesia complication     Patient Active Problem List   Diagnosis Date Noted  . Leiomyoma 02/04/2013    Past Surgical History:  Procedure Laterality Date  . ABDOMINAL HYSTERECTOMY    . CESAREAN SECTION    . LAPAROSCOPIC ASSISTED VAGINAL HYSTERECTOMY N/A 02/03/2013   Procedure: LAPAROSCOPIC ASSISTED VAGINAL HYSTERECTOMY;  Surgeon: Margarette Asal, MD;  Location: [facility];  Service: Gynecology;  Laterality: N/A;    OB History    No data available       Home Medications    Prior to Admission medications   Medication Sig Start Date End Date Taking? Authorizing Provider  benzonatate (TESSALON) 100 MG capsule Take 1 capsule (100 mg total) by mouth every 8 (eight) hours. 07/31/17   Kirichenko, Tatyana, PA-C  Camphor-Menthol-Methyl Sal (HM SALONPAS PAIN RELIEF) 1.2-5.7-6.3 % [facility] Clean and dry affected area, remove patch from backing film and apply to skin. Apply one patch to the affected area and leave in place for up to 8 to 12 hours. If pain lasts after using the first patch, a second patch may be applied for  up to another 8 to 12 hours. 09/26/17   Nelsie Domino, Elmyra Ricks, PA-C  traMADol (ULTRAM) 50 MG tablet Take 1 tablet (50 mg total) by mouth every 6 (six) hours as needed. 09/26/17   Kathleena Freeman, Elmyra Ricks, PA-C    Family History Family History  Problem Relation Age of Onset  . Diabetes Mother   . Cancer Father   . Diabetes Sister     Social History Social History   Tobacco Use  . Smoking status: Never Smoker  . Smokeless tobacco: Never Used  Substance Use Topics  . Alcohol use: No  . Drug use: No     Allergies   Percocet [oxycodone-acetaminophen]   Review of Systems Review of Systems  A complete review of systems was obtained and all systems are negative except as noted in the HPI and PMH.   Physical Exam Updated Vital Signs BP (!) 156/90 (BP Location: Left Arm)   Pulse 93   Temp 97.7 F (36.5 C) (Oral)   Resp 18   Ht 5\' 4"  (1.626 m)   Wt 113.4 kg (250 lb)   LMP 01/15/2013   SpO2 99%   BMI 42.91 kg/m   Physical Exam  Constitutional: She is oriented to person, place, and time. She appears well-developed and well-nourished. No distress.  HENT:  Head: Normocephalic and atraumatic.  Mouth/Throat: Oropharynx is clear and moist.  Eyes: Conjunctivae and EOM are normal. Pupils are equal, round, and  reactive to light.  Neck: Normal range of motion.  Cardiovascular: Normal rate, regular rhythm and intact distal pulses.  Pulmonary/Chest: Effort normal and breath sounds normal.  Abdominal: Soft. There is no tenderness.  Musculoskeletal: Normal range of motion. She exhibits tenderness.       Back:  Neurological: She is alert and oriented to person, place, and time.  Skin: She is not diaphoretic.  Psychiatric: She has a normal mood and affect.  Nursing note and vitals reviewed.    ED Treatments / Results  Labs (all labs ordered are listed, but only abnormal results are displayed) Labs Reviewed  URINALYSIS, ROUTINE W REFLEX MICROSCOPIC - Abnormal; Notable for the following  components:      Result Value   Specific Gravity, Urine >1.030 (*)    All other components within normal limits    EKG  EKG Interpretation None       Radiology No results found.  Procedures Procedures (including critical care time)  Medications Ordered in ED Medications  ketorolac (TORADOL) injection 30 mg (30 mg Intramuscular Given 09/26/17 1452)     Initial Impression / Assessment and Plan / ED Course  I have reviewed the triage vital signs and the nursing notes.  Pertinent labs & imaging results that were available during my care of the patient were reviewed by me and considered in my medical decision making (see chart for details).     Vitals:   09/26/17 1418 09/26/17 1419  BP:  (!) 156/90  Pulse:  93  Resp:  18  Temp:  97.7 F (36.5 C)  TempSrc:  Oral  SpO2:  99%  Weight: 113.4 kg (250 lb)   Height: 5\' 4"  (1.626 m)     Medications  ketorolac (TORADOL) injection 30 mg (30 mg Intramuscular Given 09/26/17 1452)    [name] is 51 y.o. female presenting with left-sided lumbar and thoracic back pain exacerbated by movement, bending and position.  There is no cough, chest pain, shortness of breath.  Physical exam is consistent with strained muscle, vital signs reassuring, lung sounds clear to auscultation, patient given Toradol in the ED, will follow closely with PCP.  Evaluation does not show pathology that would require ongoing emergent intervention or inpatient treatment. Pt is hemodynamically stable and mentating appropriately. Discussed findings and plan with patient/guardian, who agrees with care plan. All questions answered. Return precautions discussed and outpatient follow up given.      Final Clinical Impressions(s) / ED Diagnoses   Final diagnoses:  Strain of muscle and tendon of back wall of thorax, initial encounter    ED Discharge Orders        Ordered    traMADol (ULTRAM) 50 MG tablet  Every 6 hours PRN     09/26/17 1448     Camphor-Menthol-Methyl Sal (HM SALONPAS PAIN RELIEF) 1.2-5.7-6.3 % [facility]     09/26/17 1448       Toshio Slusher, Charna Elizabeth 09/26/17 1624    Quintella Reichert, MD 09/26/17 [id number]

## 2017-09-26 NOTE — Discharge Instructions (Signed)
Please follow with your primary care doctor in the next 2 days for a check-up. They must obtain records for further management.   Do not hesitate to return to the Emergency Department for any new, worsening or concerning symptoms.   For pain control please take ibuprofen (also known as Motrin or Advil) 800mg  (this is normally 4 over the counter pills) 3 times a day  for 5 days. Take with food to minimize stomach irritation.  Take vicodin for breakthrough pain, do not drink alcohol, drive, care for children or do other critical tasks while taking vicodin.

## 2017-09-26 NOTE — ED Triage Notes (Signed)
Pt c/o left lower back x 4 days

## 2018-02-22 IMAGING — CR DG CHEST 2V
2 series · 2 of 2 positions shown · non-contrast
Comparison: Chest x-ray of February 24, 2016

CLINICAL DATA: Cough, chest congestion, wheezing, shortness of
breath, and body aches for the past 5 days.

EXAM:
CHEST  2 VIEW

[w chest pa]
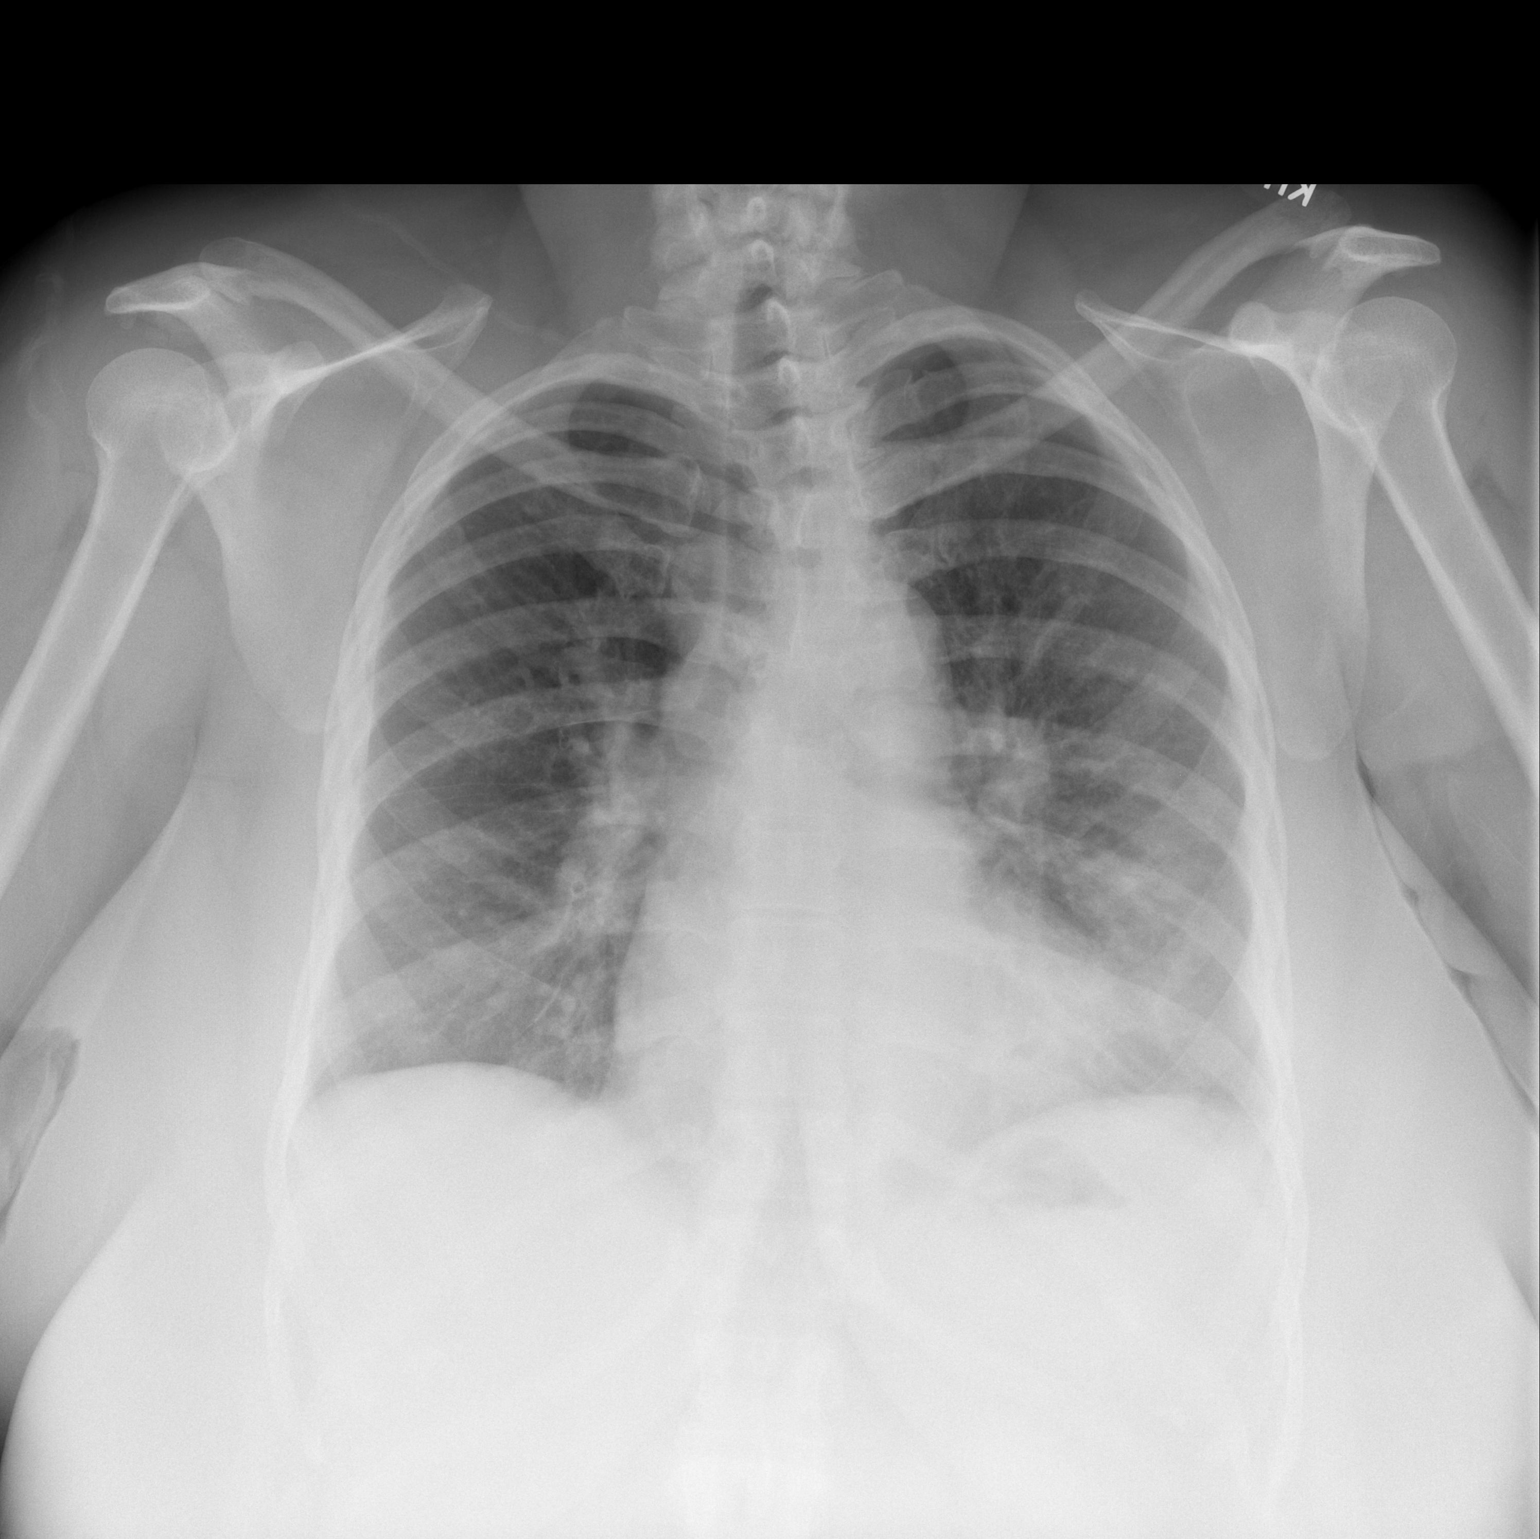

[w chest lat]
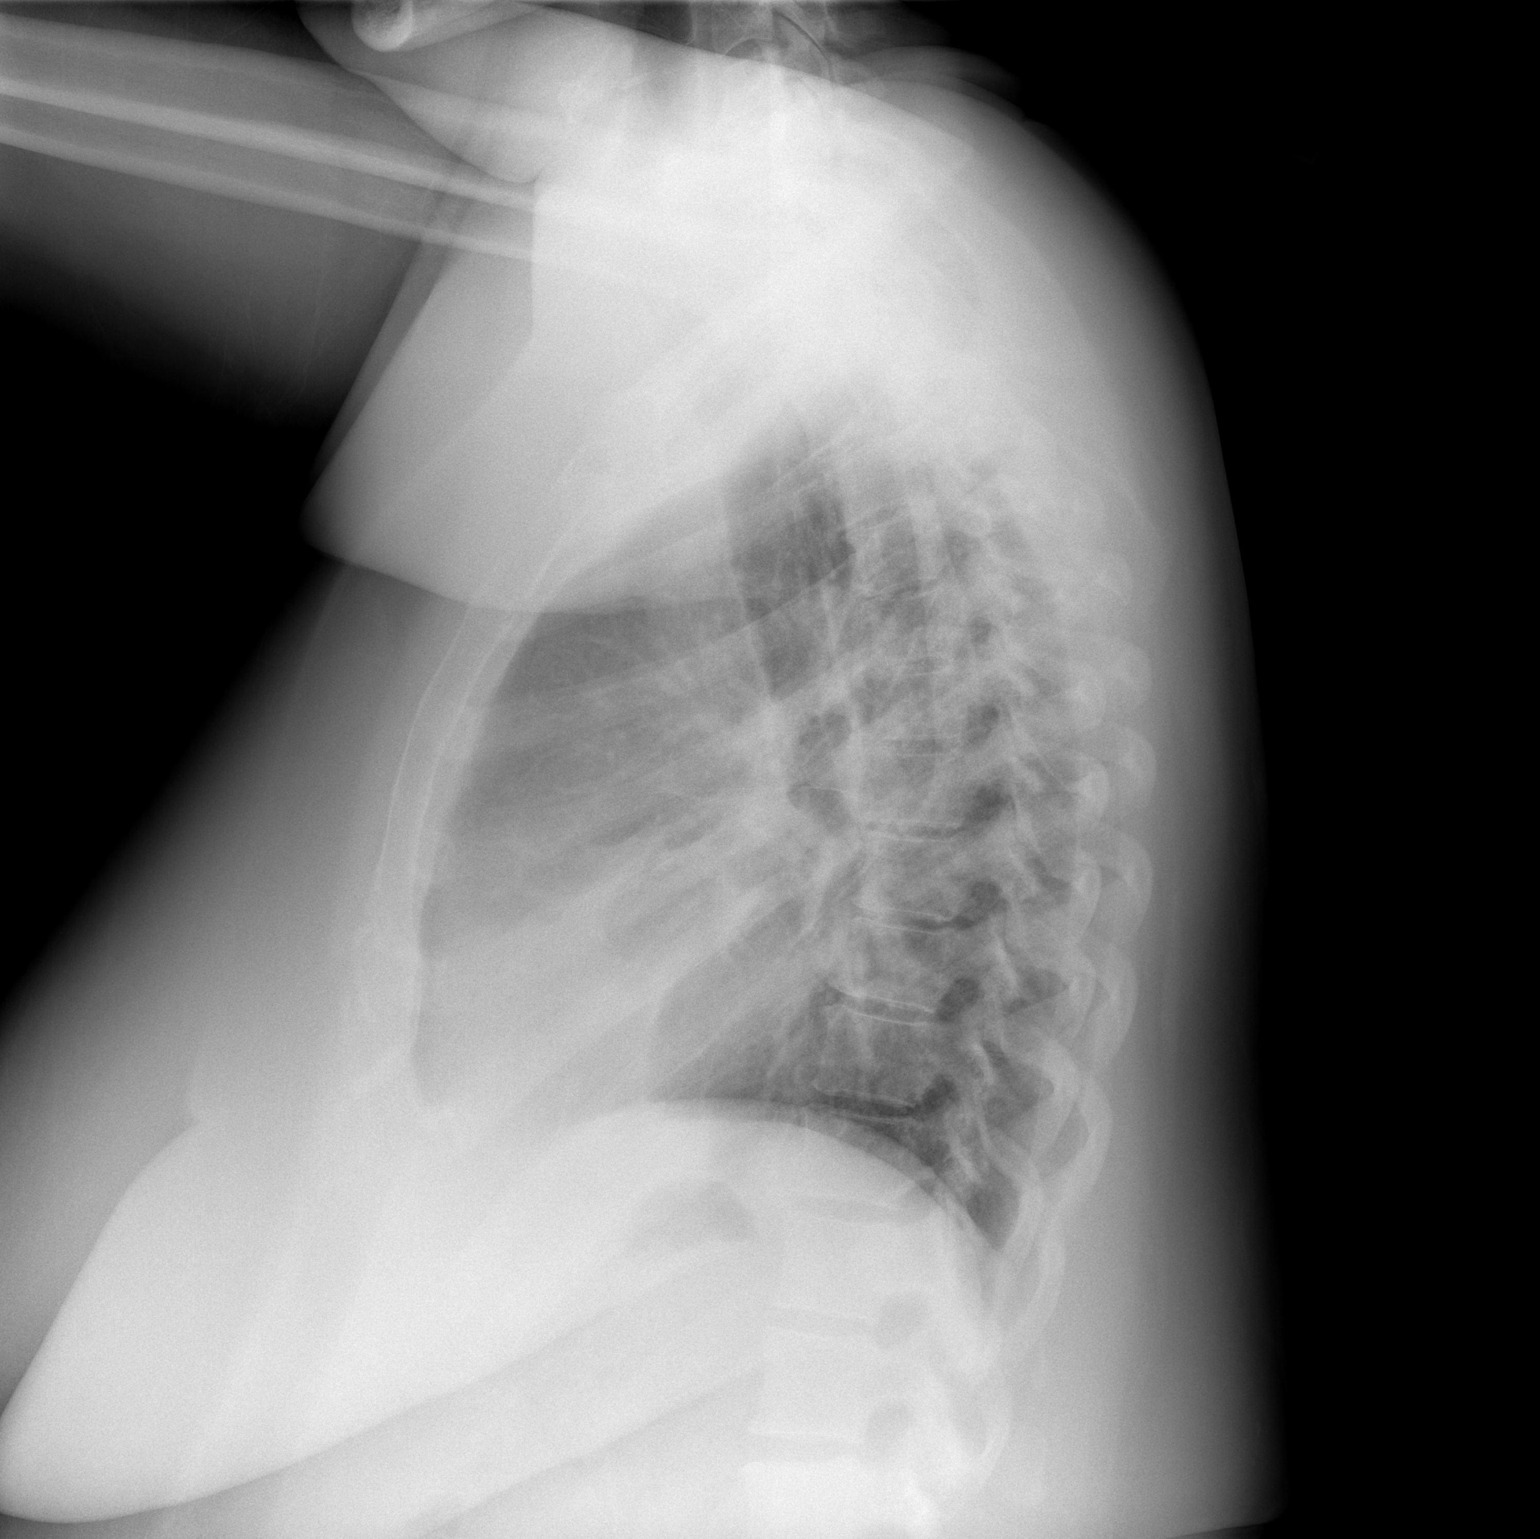

[2 of 2 positions shown; findings below may reference images not displayed]

FINDINGS: The lungs are adequately inflated. There are patchy alveolar
opacities in the lingula. The heart and pulmonary vascularity are
normal. The mediastinum is normal in width. There is no pleural
effusion.
IMPRESSION: Lingular pneumonia. Followup PA and lateral chest X-ray is
recommended in 3-4 weeks following trial of antibiotic therapy to
ensure resolution.

## 2018-06-20 ENCOUNTER — Emergency Department (HOSPITAL_BASED_OUTPATIENT_CLINIC_OR_DEPARTMENT_OTHER): Payer: BC Managed Care – PPO

## 2018-06-20 ENCOUNTER — Encounter (HOSPITAL_BASED_OUTPATIENT_CLINIC_OR_DEPARTMENT_OTHER): Payer: Self-pay

## 2018-06-20 ENCOUNTER — Other Ambulatory Visit: Payer: Self-pay

## 2018-06-20 ENCOUNTER — Emergency Department (HOSPITAL_BASED_OUTPATIENT_CLINIC_OR_DEPARTMENT_OTHER)
Admission: EM | Admit: 2018-06-20 | Discharge: 2018-06-20 | Disposition: A | Discharge status: Home / Self Care | Payer: BC Managed Care – PPO | Attending: Emergency Medicine | Admitting: Emergency Medicine

## 2018-06-20 DIAGNOSIS — M25511 Pain in right shoulder: Secondary | ICD-10-CM | POA: Diagnosis not present

## 2018-06-20 DIAGNOSIS — M546 Pain in thoracic spine: Secondary | ICD-10-CM | POA: Diagnosis not present

## 2018-06-20 DIAGNOSIS — F419 Anxiety disorder, unspecified: Secondary | ICD-10-CM | POA: Insufficient documentation

## 2018-06-20 LAB — CBC
HCT: 38 % (ref 36.0–46.0)
Hemoglobin: 12.3 g/dL (ref 12.0–15.0)
MCH: 27.9 pg (ref 26.0–34.0)
MCHC: 32.4 g/dL (ref 30.0–36.0)
MCV: 86.2 fL (ref 78.0–100.0)
PLATELETS: 408 10*3/uL — AB (ref 150–400)
RBC: 4.41 MIL/uL (ref 3.87–5.11)
RDW: 15.5 % (ref 11.5–15.5)
WBC: 9.9 10*3/uL (ref 4.0–10.5)

## 2018-06-20 LAB — BASIC METABOLIC PANEL
Anion gap: 9 (ref 5–15)
BUN: 16 mg/dL (ref 6–20)
CALCIUM: 9 mg/dL (ref 8.9–10.3)
CHLORIDE: 105 mmol/L (ref 98–111)
CO2: 28 mmol/L (ref 22–32)
CREATININE: 0.98 mg/dL (ref 0.44–1.00)
GFR calc non Af Amer: >60 mL/min (ref >60)
Glucose, Bld: 87 mg/dL (ref 70–99)
Potassium: 4.1 mmol/L (ref 3.5–5.1)
Sodium: 142 mmol/L (ref 135–145)

## 2018-06-20 LAB — TROPONIN I: Troponin I: <0.03 ng/mL (ref <0.03)

## 2018-06-20 LAB — D-DIMER, QUANTITATIVE (NOT AT ARMC): D DIMER QUANT: 0.31 ug{FEU}/mL (ref 0.00–0.50)

## 2018-06-20 MED ORDER — KETOROLAC TROMETHAMINE 30 MG/ML IJ SOLN
15.0000 mg | Freq: Once | INTRAMUSCULAR | Status: AC
  Administered 2018-06-20: 15 mg via INTRAVENOUS
  Filled 2018-06-20: qty 1

## 2018-06-20 MED ORDER — NAPROXEN 500 MG PO TABS: 500.0000 mg | ORAL_TABLET | Freq: Two times a day (BID) | ORAL | 0 refills | Status: DC

## 2018-06-20 MED ORDER — METHOCARBAMOL 500 MG PO TABS: 500.0000 mg | ORAL_TABLET | Freq: Two times a day (BID) | ORAL | 0 refills | Status: DC

## 2018-06-20 MED ORDER — METHOCARBAMOL 500 MG PO TABS
1000.0000 mg | ORAL_TABLET | Freq: Once | ORAL | Status: AC
  Administered 2018-06-20: 1000 mg via ORAL
  Filled 2018-06-20: qty 2

## 2018-06-20 NOTE — ED Triage Notes (Signed)
C/o right shoulder and right mid back pain x 2 days-states pain os worse with deep breath and movement-denies injury-states hx of "pulled muscle" to area and feels the same-NAD-steady gait

## 2018-06-20 NOTE — ED Provider Notes (Signed)
[facility] EMERGENCY DEPARTMENT Provider Note   CSN: [id number] Arrival date & time: 06/20/18  1602     History   Chief Complaint Chief Complaint  Patient presents with  . Shoulder Pain    HPI [name] is a 52 y.o. female.  [name] is a 52 y.o. Female with a history of anemia and anxiety, who presents to the emergency department for evaluation of right-sided thoracic back pain radiating into the right shoulder that started on Wednesday evening.  Reports pain is persisted over the past 2 days.  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  She does report pain when she takes a deep breath, but no associated shortness of breath or chest pain.  No numbness, tingling or weakness radiating into her right arm, and no neck pain.  No abdominal pain, nausea or vomiting, no low back pain.  She denies history of PE or DVT, although does have family history.  She has had some intermittent swelling in the right lower leg before being put on blood pressure medication, but denies any today.  No recent long distance travel or surgeries.  No cough or hemoptysis.  Not currently on any birth control.  She does report she took a BC today at about 130 without relief, has not tried anything else to treat her symptoms prior to arrival, no other aggravating or alleviating factors.     Past Medical History:  Diagnosis Date  . Anemia   . Anxiety   . Family history of anesthesia complication     Patient Active Problem List   Diagnosis Date Noted  . Leiomyoma 02/04/2013    Past Surgical History:  Procedure Laterality Date  . ABDOMINAL HYSTERECTOMY    . CESAREAN SECTION    . LAPAROSCOPIC ASSISTED VAGINAL HYSTERECTOMY N/A 02/03/2013   Procedure: LAPAROSCOPIC ASSISTED VAGINAL HYSTERECTOMY;  Surgeon: Margarette Asal, MD;  Location: [facility];  Service: Gynecology;  Laterality: N/A;     OB History   None      Home Medications    Prior to Admission medications   Medication Sig Start Date End Date Taking? Authorizing Provider  benzonatate (TESSALON) 100 MG capsule Take 1 capsule (100 mg total) by mouth every 8 (eight) hours. 07/31/17   Kirichenko, Tatyana, PA-C  Camphor-Menthol-Methyl Sal (HM SALONPAS PAIN RELIEF) 1.2-5.7-6.3 % PTCH Clean and dry affected area, remove patch from backing film and apply to skin. Apply one patch to the affected area and leave in place for up to 8 to 12 hours. If pain lasts after using the first patch, a second patch may be applied for up to another 8 to 12 hours. 09/26/17   Pisciotta, Elmyra Ricks, PA-C  methocarbamol (ROBAXIN) 500 MG tablet Take 1 tablet (500 mg total) by mouth 2 (two) times daily. 06/20/18   Jacqlyn Larsen, PA-C  naproxen (NAPROSYN) 500 MG tablet Take 1 tablet (500 mg total) by mouth 2 (two) times daily. 06/20/18   Jacqlyn Larsen, PA-C  traMADol (ULTRAM) 50 MG tablet Take 1 tablet (50 mg total) by mouth every 6 (six) hours as needed. 09/26/17   Pisciotta, Elmyra Ricks, PA-C    Family History Family History  Problem Relation Age of Onset  . Diabetes Mother   . Cancer Father   . Diabetes Sister  Social History Social History   Tobacco Use  . Smoking status: Never Smoker  . Smokeless tobacco: Never Used  Substance Use Topics  . Alcohol use: No  . Drug use: No     Allergies   Percocet [oxycodone-acetaminophen]   Review of Systems Review of Systems  Constitutional: Negative for chills and fever.  HENT: Negative.   Eyes: Negative for visual disturbance.  Respiratory: Negative for cough, chest tightness, shortness of breath and wheezing.   Cardiovascular: Negative for chest pain, palpitations and leg swelling.  Gastrointestinal: Negative for abdominal pain, nausea and vomiting.  Genitourinary: Negative for dysuria and frequency.    Musculoskeletal: Positive for back pain. Negative for neck pain and neck stiffness.  Skin: Negative for color change and rash.  Neurological: Negative for weakness and numbness.     Physical Exam Updated Vital Signs BP 127/65 (BP Location: Left Arm)   Pulse 89   Temp 98.2 F (36.8 C) (Oral)   Resp 18   Ht 5\' 4"  (1.626 m)   Wt 114.3 kg   LMP 01/15/2013   SpO2 100%   BMI 43.26 kg/m   Physical Exam  Constitutional: She is oriented to person, place, and time. She appears well-developed and well-nourished. No distress.  HENT:  Head: Normocephalic and atraumatic.  Mouth/Throat: Oropharynx is clear and moist.  Eyes: Right eye exhibits no discharge. Left eye exhibits no discharge.  Neck: Neck supple.  Cardiovascular: Normal rate, regular rhythm, normal heart sounds and intact distal pulses. Exam reveals no gallop and no friction rub.  No murmur heard. Pulmonary/Chest: Effort normal and breath sounds normal. No stridor. No respiratory distress. She has no wheezes. She has no rales.      Respirations equal and unlabored, patient able to speak in full sentences, lungs clear to auscultation bilaterally, no anterior chest wall tenderness  Abdominal: Soft. Bowel sounds are normal. She exhibits no distension and no mass. There is no tenderness. There is no guarding.  Abdomen soft, nondistended, nontender to palpation in all quadrants without guarding or peritoneal signs  Musculoskeletal:  Tenderness to palpation over the right thoracic back, primarily over the scapula and trapezius, some mild tenderness over the posterior shoulder but shoulder with full range of motion, no pain radiating into the right arm, normal range of motion at the elbow and wrist, 2+ radial pulse, sensation intact and 5/5 strength.  There is no midline thoracic tenderness  Neurological: She is alert and oriented to person, place, and time. Coordination normal.  Skin: Skin is warm and dry. Capillary refill takes less  than 2 seconds. She is not diaphoretic.  Psychiatric: She has a normal mood and affect. Her behavior is normal.  Nursing note and vitals reviewed.    ED Treatments / Results  Labs (all labs ordered are listed, but only abnormal results are displayed) Labs Reviewed  CBC - Abnormal; Notable for the following components:      Result Value   Platelets 408 (*)    All other components within normal limits  BASIC METABOLIC PANEL  D-DIMER, QUANTITATIVE (NOT AT [facility])  TROPONIN I    EKG EKG Interpretation  Date/Time:  Friday June 20 2018 16:57:20 EDT Ventricular Rate:  80 PR Interval:    QRS Duration: 94 QT Interval:  408 QTC Calculation: 471 R Axis:   29 Text Interpretation:  Sinus rhythm No STEMI.  Confirmed by Nanda Quinton [phone]) on 06/20/2018 5:07:48 PM   Radiology Dg Chest 2 View  Result Date: 06/20/2018 CLINICAL DATA:  Initial evaluation for acute right upper back pain radiating into neck. EXAM: CHEST - 2 VIEW COMPARISON:  Prior radiograph from 07/31/2017. FINDINGS: The cardiac and mediastinal silhouettes are stable in size and contour, and remain within normal limits. The lungs are normally inflated. No airspace consolidation, pleural effusion, or pulmonary edema is identified. There is no pneumothorax. No acute osseous abnormality identified. IMPRESSION: No active cardiopulmonary disease. Electronically Signed   By: Jeannine Boga M.D.   On: 06/20/2018 16:51    Procedures Procedures (including critical care time)  Medications Ordered in ED Medications  methocarbamol (ROBAXIN) tablet 1,000 mg (1,000 mg Oral Given 06/20/18 1710)  ketorolac (TORADOL) 30 MG/ML injection 15 mg (15 mg Intravenous Given 06/20/18 1711)     Initial Impression / Assessment and Plan / ED Course  I have reviewed the triage vital signs and the nursing notes.  Pertinent labs & imaging results that were available during my care of the patient were reviewed by me and considered in my medical  decision making (see chart for details).  Patient presents with 3 days of right thoracic back pain that radiates into the right shoulder.  No associated neck pain, no numbness, tingling or weakness in the right arm.  She does report some pleuritic pain associated with this.  Not experiencing any shortness of breath, cough or chest pain.  She does report that she is had some intermittent swelling in her right lower leg which she attributes this to her blood pressure medication.  She does have family history of PE and DVT, but none personally, no recent long distance travel or surgeries.  Patient's vitals are stable here but due to her age and story patient cannot be PERC negative.  I suspect that this pain is musculoskeletal and she has tenderness over the right scapula and trapezius muscle.  But will get basic labs, troponin, d-dimer, EKG and chest x-ray.  Robaxin and Toradol for symptomatic management.  Patient's evaluation is very reassuring, no acute electrolyte derangements, normal renal function, no leukocytosis and normal hemoglobin.  Slightly elevated platelets.  Negative troponin and negative d-dimer.  EKG without concerning ischemic changes.  And chest x-ray is clear.  Some improvement with symptomatic management while here in the ED.  Given reassuring work-up I do feel this is likely muscle spasm, will treat with NSAIDs and muscle relaxers, encouraged to use over-the-counter lidocaine patches as well as heat.  Patient to follow-up with her primary care doctor.  Return precautions discussed.  Patient expresses understanding and is in agreement with this plan.  Stable for discharge home at this time.  Final Clinical Impressions(s) / ED Diagnoses   Final diagnoses:  Acute right-sided thoracic back pain  Acute pain of right shoulder    ED Discharge Orders         Ordered    methocarbamol (ROBAXIN) 500 MG tablet  2 times daily     06/20/18 1750    naproxen (NAPROSYN) 500 MG tablet  2 times  daily     06/20/18 1750           Jacqlyn Larsen, [place] 06/20/18 1750    Long, Wonda Olds, MD 06/20/18 1757

## 2018-06-20 NOTE — ED Notes (Signed)
Patient transported to X-ray 

## 2018-06-20 NOTE — Discharge Instructions (Signed)
Your work-up today is very reassuring and does not suggest an acute problem with your heart or lungs causing the symptoms you are having today.  I suspect this is due to muscle spasm.  Please use Naprosyn twice daily, and Robaxin as needed to help with muscle spasm and pain.  Robaxin can cause some drowsiness do not take before driving or operating machinery, not combine with alcohol.  You can combine these medications with Tylenol but do not use any other over-the-counter pain relievers.  You can try salon pas lidocaine patches over the area as well as ice and heat.  If symptoms are not improving please were follow-up with your regular doctor.  Return to the emergency department for significantly worsened pain, chest pain or shortness of breath, numbness or weakness in your right arm or any other new or concerning symptoms.

## 2018-06-20 NOTE — ED Notes (Signed)
Pt c/o scapula pain at the end of the day on Wednesday evening. She took [facility] today at 1330 with no relief.

## 2018-07-02 ENCOUNTER — Encounter (HOSPITAL_BASED_OUTPATIENT_CLINIC_OR_DEPARTMENT_OTHER): Payer: Self-pay | Admitting: Emergency Medicine

## 2018-07-02 ENCOUNTER — Emergency Department (HOSPITAL_BASED_OUTPATIENT_CLINIC_OR_DEPARTMENT_OTHER)
Admission: EM | Admit: 2018-07-02 | Discharge: 2018-07-02 | Disposition: A | Discharge status: Home / Self Care | Payer: BC Managed Care – PPO | Attending: Emergency Medicine | Admitting: Emergency Medicine

## 2018-07-02 ENCOUNTER — Other Ambulatory Visit: Payer: Self-pay

## 2018-07-02 DIAGNOSIS — R0981 Nasal congestion: Secondary | ICD-10-CM | POA: Diagnosis not present

## 2018-07-02 DIAGNOSIS — Z79899 Other long term (current) drug therapy: Secondary | ICD-10-CM | POA: Insufficient documentation

## 2018-07-02 DIAGNOSIS — J111 Influenza due to unidentified influenza virus with other respiratory manifestations: Secondary | ICD-10-CM | POA: Diagnosis not present

## 2018-07-02 DIAGNOSIS — M7918 Myalgia, other site: Secondary | ICD-10-CM | POA: Insufficient documentation

## 2018-07-02 DIAGNOSIS — R509 Fever, unspecified: Secondary | ICD-10-CM | POA: Insufficient documentation

## 2018-07-02 DIAGNOSIS — R51 Headache: Secondary | ICD-10-CM | POA: Insufficient documentation

## 2018-07-02 DIAGNOSIS — R69 Illness, unspecified: Secondary | ICD-10-CM

## 2018-07-02 DIAGNOSIS — R05 Cough: Secondary | ICD-10-CM | POA: Diagnosis present

## 2018-07-02 DIAGNOSIS — J0110 Acute frontal sinusitis, unspecified: Secondary | ICD-10-CM | POA: Diagnosis not present

## 2018-07-02 MED ORDER — AMOXICILLIN-POT CLAVULANATE 875-125 MG PO TABS: 1.0000 | ORAL_TABLET | Freq: Two times a day (BID) | ORAL | 0 refills | Status: DC

## 2018-07-02 MED ORDER — BENZONATATE 100 MG PO CAPS: 100.0000 mg | ORAL_CAPSULE | Freq: Three times a day (TID) | ORAL | 0 refills | Status: DC

## 2018-07-02 NOTE — ED Notes (Signed)
ED Provider at bedside. 

## 2018-07-02 NOTE — Discharge Instructions (Signed)
Take tylenol 2 pills 4 times a day and motrin 4 pills 3 times a day.  Drink plenty of fluids.  Return for worsening shortness of breath, headache, confusion. Follow up with your family doctor.   

## 2018-07-02 NOTE — ED Triage Notes (Signed)
Cough, sore throat, body aches x 5 days.

## 2018-07-02 NOTE — ED Provider Notes (Addendum)
[facility] EMERGENCY DEPARTMENT Provider Note   CSN: [id number] Arrival date & time: 07/02/18  1619     History   Chief Complaint Chief Complaint  Patient presents with  . URI    HPI NEVEEN DAPONTE is a 52 y.o. female.  52 yo F with a chief complaint of cough congestion fevers chills and myalgias.  This been going on for the past 5 days.  She has 2 grandchildren that are sick.  She has had worsening symptoms over that timeframe.  She denies shortness of breath.  Has had a mild headache that is worse with coughing.  She denies vomiting denies abdominal pain denies diarrhea.  Complaining of aches all over her body.  Subjectively febrile and with chills.  The history is provided by the patient.  URI   This is a new problem. The current episode started more than 2 days ago. The problem has not changed since onset.Maximum temperature: subjective. The fever has been present for 3 to 4 days. Associated symptoms include congestion and cough. Pertinent negatives include no chest pain, no nausea, no vomiting, no dysuria, no headaches, no rhinorrhea and no wheezing. She has tried nothing for the symptoms. The treatment provided no relief.  Illness  This is a new problem. The current episode started more than 2 days ago. The problem occurs constantly. The problem has been gradually worsening. Pertinent negatives include no chest pain, no headaches and no shortness of breath. Nothing aggravates the symptoms. Nothing relieves the symptoms. She has tried nothing for the symptoms. The treatment provided no relief.    Past Medical History:  Diagnosis Date  . Anemia   . Anxiety   . Family history of anesthesia complication     Patient Active Problem List   Diagnosis Date Noted  . Leiomyoma 02/04/2013    Past Surgical History:  Procedure Laterality Date  . ABDOMINAL HYSTERECTOMY    . CESAREAN SECTION    . LAPAROSCOPIC ASSISTED VAGINAL HYSTERECTOMY N/A 02/03/2013   Procedure:  LAPAROSCOPIC ASSISTED VAGINAL HYSTERECTOMY;  Surgeon: Margarette Asal, MD;  Location: [facility];  Service: Gynecology;  Laterality: N/A;     OB History   None      Home Medications    Prior to Admission medications   Medication Sig Start Date End Date Taking? Authorizing Provider  amoxicillin-clavulanate (AUGMENTIN) 875-125 MG tablet Take 1 tablet by mouth every 12 (twelve) hours. 07/02/18   Deno Etienne, DO  benzonatate (TESSALON) 100 MG capsule Take 1 capsule (100 mg total) by mouth every 8 (eight) hours. 07/02/18   Deno Etienne, DO  Camphor-Menthol-Methyl Sal (HM SALONPAS PAIN RELIEF) 1.2-5.7-6.3 % PTCH Clean and dry affected area, remove patch from backing film and apply to skin. Apply one patch to the affected area and leave in place for up to 8 to 12 hours. If pain lasts after using the first patch, a second patch may be applied for up to another 8 to 12 hours. 09/26/17   Pisciotta, Elmyra Ricks, PA-C  methocarbamol (ROBAXIN) 500 MG tablet Take 1 tablet (500 mg total) by mouth 2 (two) times daily. 06/20/18   Jacqlyn Larsen, PA-C  naproxen (NAPROSYN) 500 MG tablet Take 1 tablet (500 mg total) by mouth 2 (two) times daily. 06/20/18   Jacqlyn Larsen, PA-C  traMADol (ULTRAM) 50 MG tablet Take 1 tablet (50 mg total) by mouth every 6 (six) hours as needed. 09/26/17   Pisciotta, Charna Elizabeth    Family History Family History  Problem Relation Age of Onset  . Diabetes Mother   . Cancer Father   . Diabetes Sister     Social History Social History   Tobacco Use  . Smoking status: Never Smoker  . Smokeless tobacco: Never Used  Substance Use Topics  . Alcohol use: No  . Drug use: No     Allergies   Percocet [oxycodone-acetaminophen]   Review of Systems Review of Systems  Constitutional: Positive for chills and fever (subjective).  HENT: Positive for congestion. Negative for rhinorrhea.   Eyes: Negative for redness and visual disturbance.  Respiratory: Positive for cough. Negative for  shortness of breath and wheezing.   Cardiovascular: Negative for chest pain and palpitations.  Gastrointestinal: Negative for nausea and vomiting.  Genitourinary: Negative for dysuria and urgency.  Musculoskeletal: Positive for myalgias. Negative for arthralgias.  Skin: Negative for pallor and wound.  Neurological: Negative for dizziness and headaches.     Physical Exam Updated Vital Signs BP (!) 130/91 (BP Location: Left Arm)   Pulse 94   Temp 98.5 F (36.9 C) (Oral)   Resp 20   Ht 5\' 4"  (1.626 m)   Wt 114.3 kg   LMP 01/15/2013   SpO2 98%   BMI 43.25 kg/m   Physical Exam  Constitutional: She is oriented to person, place, and time. She appears well-developed and well-nourished. No distress.  HENT:  Head: Normocephalic and atraumatic.  Swollen turbinates, posterior nasal drip, left frontal sinus is exquisitely tender to percussion, tm normal bilaterally.     Eyes: Pupils are equal, round, and reactive to light. EOM are normal.  Neck: Normal range of motion. Neck supple.  Cardiovascular: Normal rate and regular rhythm. Exam reveals no gallop and no friction rub.  No murmur heard. Pulmonary/Chest: Effort normal. She has no wheezes. She has no rales.  Abdominal: Soft. She exhibits no distension. There is no tenderness.  Musculoskeletal: She exhibits no edema or tenderness.  Neurological: She is alert and oriented to person, place, and time.  Skin: Skin is warm and dry. She is not diaphoretic.  Psychiatric: She has a normal mood and affect. Her behavior is normal.  Nursing note and vitals reviewed.    ED Treatments / Results  Labs (all labs ordered are listed, but only abnormal results are displayed) Labs Reviewed - No data to display  EKG None  Radiology No results found.  Procedures Procedures (including critical care time)  Medications Ordered in ED Medications - No data to display   Initial Impression / Assessment and Plan / ED Course  I have reviewed  the triage vital signs and the nursing notes.  Pertinent labs & imaging results that were available during my care of the patient were reviewed by me and considered in my medical decision making (see chart for details).     52 yo F with a chief complaint of an influenza-like illness.  The patient has significant sinus tenderness on percussion on my exam.  I discussed with her that her symptoms are likely viral and suggested symptomatic therapy.  I will cover her for possible sinusitis as well.  PCP follow up.  5:34 PM:  I have discussed the diagnosis/risks/treatment options with the patient and family and believe the pt to be eligible for discharge home to follow-up with PCP. We also discussed returning to the ED immediately if new or worsening sx occur. We discussed the sx which are most concerning (e.g., sudden worsening pain, fever, inability to tolerate by mouth) that necessitate  immediate return. Medications administered to the patient during their visit and any new prescriptions provided to the patient are listed below.  Medications given during this visit Medications - No data to display    The patient appears reasonably screen and/or stabilized for discharge and I doubt any other medical condition or other [facility] requiring further screening, evaluation, or treatment in the ED at this time prior to discharge.    Final Clinical Impressions(s) / ED Diagnoses   Final diagnoses:  Influenza-like illness  Acute frontal sinusitis, recurrence not specified    ED Discharge Orders         Ordered    amoxicillin-clavulanate (AUGMENTIN) 875-125 MG tablet  Every 12 hours     07/02/18 1728    benzonatate (TESSALON) 100 MG capsule  Every 8 hours     07/02/18 1729           Deno Etienne, DO 07/02/18 Nicolaus, [place], DO 07/02/18 1734

## 2018-07-02 NOTE — ED Notes (Signed)
Pt does confirm similar sensation last year when dx with pneumonia and influenza. States "this time it's not as bad but similar." Pt has not received flu vaccine this season.

## 2018-08-17 ENCOUNTER — Emergency Department (HOSPITAL_BASED_OUTPATIENT_CLINIC_OR_DEPARTMENT_OTHER)
Admission: EM | Admit: 2018-08-17 | Discharge: 2018-08-17 | Disposition: A | Discharge status: Home / Self Care | Payer: BC Managed Care – PPO | Attending: Emergency Medicine | Admitting: Emergency Medicine

## 2018-08-17 ENCOUNTER — Encounter (HOSPITAL_BASED_OUTPATIENT_CLINIC_OR_DEPARTMENT_OTHER): Payer: Self-pay | Admitting: *Deleted

## 2018-08-17 ENCOUNTER — Other Ambulatory Visit: Payer: Self-pay

## 2018-08-17 DIAGNOSIS — H9201 Otalgia, right ear: Secondary | ICD-10-CM | POA: Insufficient documentation

## 2018-08-17 DIAGNOSIS — J0100 Acute maxillary sinusitis, unspecified: Secondary | ICD-10-CM | POA: Diagnosis not present

## 2018-08-17 DIAGNOSIS — J3489 Other specified disorders of nose and nasal sinuses: Secondary | ICD-10-CM | POA: Diagnosis not present

## 2018-08-17 DIAGNOSIS — R0981 Nasal congestion: Secondary | ICD-10-CM | POA: Diagnosis present

## 2018-08-17 MED ORDER — FLUTICASONE PROPIONATE 50 MCG/ACT NA SUSP: 2.0000 | Freq: Every day | NASAL | 0 refills | Status: DC

## 2018-08-17 MED ORDER — PREDNISONE 10 MG (21) PO TBPK: ORAL_TABLET | ORAL | 0 refills | Status: DC

## 2018-08-17 NOTE — ED Notes (Signed)
ED Provider at bedside. 

## 2018-08-17 NOTE — ED Triage Notes (Signed)
Stuffy nose since Wednesday. Took OTC med without relief.  Can't hear of her right ear and generalized weakness.

## 2018-08-17 NOTE — ED Provider Notes (Signed)
[facility] EMERGENCY DEPARTMENT Provider Note   CSN: [id number] Arrival date & time: 08/17/18  0930     History   Chief Complaint Chief Complaint  Patient presents with  . Sinus infection    HPI [name] is a 52 y.o. female.  HPI   [name] is a 52 y.o. female, with a history of anxiety and anemia, presenting to the ED with nasal congestion, sinus pressure, and right ear pressure for the last 4 days. She has tried Tylenol-based over-the-counter medications without complete relief. Denies dizziness, fever, cough, sore throat, rhinorrhea, ear pain, ear drainage, or any other complaints.     Past Medical History:  Diagnosis Date  . Anemia   . Anxiety   . Family history of anesthesia complication     Patient Active Problem List   Diagnosis Date Noted  . Leiomyoma 02/04/2013    Past Surgical History:  Procedure Laterality Date  . ABDOMINAL HYSTERECTOMY    . CESAREAN SECTION    . LAPAROSCOPIC ASSISTED VAGINAL HYSTERECTOMY N/A 02/03/2013   Procedure: LAPAROSCOPIC ASSISTED VAGINAL HYSTERECTOMY;  Surgeon: Margarette Asal, MD;  Location: [facility];  Service: Gynecology;  Laterality: N/A;     OB History    Gravida  5   Para  0   Term  0   Preterm  0   AB  0   Living  0     SAB  0   TAB  0   Ectopic  0   Multiple  1   Live Births  0            Home Medications    Prior to Admission medications   Medication Sig Start Date End Date Taking? Authorizing Provider  amLODipine (NORVASC) 2.5 MG tablet Take 2.5 mg by mouth daily.   Yes [provider]  carvedilol (COREG) 12.5 MG tablet Take 12.5 mg by mouth daily.   Yes [provider]  Vitamin D, Ergocalciferol, (DRISDOL) 1.25 MG (50000 UT) CAPS capsule Take 50,000 Units by mouth every 7 (seven) days.   Yes [provider]  fluticasone (FLONASE) 50 MCG/ACT nasal spray Place 2 sprays into both nostrils daily. 08/17/18   Marymargaret Kirker C, PA-C    predniSONE (STERAPRED UNI-PAK 21 TAB) 10 MG (21) TBPK tablet Take 6 tabs (60mg ) day 1, 5 tabs (50mg ) day 2, 4 tabs (40mg ) day 3, 3 tabs (30mg ) day 4, 2 tabs (20mg ) day 5, and 1 tab (10mg ) day 6. 08/17/18   Penelope Fittro, Helane Gunther, PA-C    Family History Family History  Problem Relation Age of Onset  . Diabetes Mother   . Cancer Father   . Diabetes Sister     Social History Social History   Tobacco Use  . Smoking status: Never Smoker  . Smokeless tobacco: Never Used  Substance Use Topics  . Alcohol use: No  . Drug use: No     Allergies   Percocet [oxycodone-acetaminophen]   Review of Systems Review of Systems  Constitutional: Negative for chills and fever.  HENT: Positive for congestion and sinus pressure. Negative for ear discharge, ear pain, sore throat and trouble swallowing.   Respiratory: Negative for cough and shortness of breath.   Cardiovascular: Negative for chest pain.  Gastrointestinal: Negative for nausea and vomiting.  Musculoskeletal: Negative for neck pain and neck stiffness.  Neurological: Negative for dizziness, light-headedness and headaches.  All other systems reviewed and are negative.    Physical Exam Updated Vital  Signs BP (!) 141/89 (BP Location: Right Arm)   Pulse (!) 110   Temp 97.9 F (36.6 C) (Oral)   Resp 16   Ht 5\' 4"  (1.626 m)   Wt 117.4 kg   LMP 01/15/2013   SpO2 98%   BMI 44.43 kg/m   Physical Exam  Constitutional: She appears well-developed and well-nourished. No distress.  HENT:  Head: Normocephalic and atraumatic.  Right Ear: Tympanic membrane, external ear and ear canal normal.  Left Ear: Tympanic membrane, external ear and ear canal normal.  Nose: Mucosal edema present. Right sinus exhibits maxillary sinus tenderness. Left sinus exhibits maxillary sinus tenderness.  Mouth/Throat: Oropharynx is clear and moist.  Eyes: Conjunctivae are normal.  Neck: Neck supple.  Cardiovascular: Normal rate, regular rhythm and intact distal  pulses.  Pulmonary/Chest: Effort normal. No respiratory distress.  Abdominal: There is no guarding.  Musculoskeletal: She exhibits no edema.  Lymphadenopathy:    She has no cervical adenopathy.  Neurological: She is alert.  Skin: Skin is warm and dry. She is not diaphoretic.  Psychiatric: She has a normal mood and affect. Her behavior is normal.  Nursing note and vitals reviewed.    ED Treatments / Results  Labs (all labs ordered are listed, but only abnormal results are displayed) Labs Reviewed - No data to display  EKG None  Radiology No results found.  Procedures Procedures (including critical care time)  Medications Ordered in ED Medications - No data to display   Initial Impression / Assessment and Plan / ED Course  I have reviewed the triage vital signs and the nursing notes.  Pertinent labs & imaging results that were available during my care of the patient were reviewed by me and considered in my medical decision making (see chart for details).     Patient presents with sinus congestion.  There are no features in patient history or physical exam that suggest bacterial infection at this point in her illness. Patient has a PCP with whom she can follow-up. The patient was given instructions for home care as well as return precautions. Patient voices understanding of these instructions, accepts the plan, and is comfortable with discharge.   Findings and plan of care discussed with Sherwood Gambler, MD.   Final Clinical Impressions(s) / ED Diagnoses   Final diagnoses:  Acute non-recurrent maxillary sinusitis    ED Discharge Orders         Ordered    predniSONE (STERAPRED UNI-PAK 21 TAB) 10 MG (21) TBPK tablet     08/17/18 1024    fluticasone (FLONASE) 50 MCG/ACT nasal spray  Daily     08/17/18 1024           Lorayne Bender, PA-C 08/17/18 1025    Sherwood Gambler, MD 08/17/18 1053

## 2018-08-17 NOTE — Discharge Instructions (Signed)
Your symptoms are likely consistent with a viral illness. Viruses do not require or respond to antibiotics. Treatment is symptomatic care and it is important to note that these symptoms may last for 7-14 days.   Hand washing: Wash your hands throughout the day, but especially before and after touching the face, using the restroom, sneezing, coughing, or touching surfaces that have been coughed or sneezed upon. Hydration: Symptoms will be intensified and complicated by dehydration. Dehydration can also extend the duration of symptoms. Drink plenty of fluids and get plenty of rest. You should be drinking at least half a liter of water an hour to stay hydrated. Electrolyte drinks (ex. Gatorade, Powerade, Pedialyte) are also encouraged. You should be drinking enough fluids to make your urine light yellow, almost clear. If this is not the case, you are not drinking enough water. Please note that some of the treatments indicated below will not be effective if you are not adequately hydrated. Pain or fever: Ibuprofen, Naproxen, or acetaminophen (generic for Tylenol) for pain or fever.  Antiinflammatory medications: Take 600 mg of ibuprofen every 6 hours or 440 mg (over the counter dose) to 500 mg (prescription dose) of naproxen every 12 hours for the next 3 days. After this time, these medications may be used as needed for pain. Take these medications with food to avoid upset stomach. Choose only one of these medications, do not take them together. Acetaminophen (generic for Tylenol): Should you continue to have additional pain while taking the ibuprofen or naproxen, you may add in acetaminophen as needed. Your daily total maximum amount of acetaminophen from all sources should be limited to 4000mg /day for persons without liver problems, or 2000mg /day for those with liver problems. Prednisone: Take the prednisone, as directed, in its entirety. Zyrtec or Claritin: May add these medication daily to control  underlying symptoms of congestion, sneezing, and other signs of allergies.  These medications are available over-the-counter. Generics: Cetirizine (generic for Zyrtec) and loratadine (generic for Claritin). Fluticasone: Use fluticasone (generic for Flonase), as directed, for nasal and sinus congestion.  This medication is available over-the-counter. Congestion: Plain guaifenesin (generic for plain Mucinex) may help relieve congestion. Saline sinus rinses and saline nasal sprays may also help relieve congestion.  Sore throat: Warm liquids or Chloraseptic spray may help soothe a sore throat. Gargle twice a day with a salt water solution made from a half teaspoon of salt in a cup of warm water.  Follow up: Follow up with a primary care provider within the next two weeks should symptoms fail to resolve. Return: Return to the ED for significantly worsening symptoms, shortness of breath, persistent vomiting, large amounts of blood in stool, or any other major concerns.  For prescription assistance, may try using prescription discount sites or apps, such as goodrx.com

## 2018-08-27 ENCOUNTER — Emergency Department (HOSPITAL_BASED_OUTPATIENT_CLINIC_OR_DEPARTMENT_OTHER)
Admission: EM | Admit: 2018-08-27 | Discharge: 2018-08-27 | Disposition: A | Discharge status: Home / Self Care | Payer: BC Managed Care – PPO | Attending: Emergency Medicine | Admitting: Emergency Medicine

## 2018-08-27 ENCOUNTER — Other Ambulatory Visit: Payer: Self-pay

## 2018-08-27 ENCOUNTER — Encounter (HOSPITAL_BASED_OUTPATIENT_CLINIC_OR_DEPARTMENT_OTHER): Payer: Self-pay | Admitting: *Deleted

## 2018-08-27 DIAGNOSIS — J0100 Acute maxillary sinusitis, unspecified: Secondary | ICD-10-CM | POA: Diagnosis not present

## 2018-08-27 DIAGNOSIS — F419 Anxiety disorder, unspecified: Secondary | ICD-10-CM | POA: Diagnosis not present

## 2018-08-27 DIAGNOSIS — Z79899 Other long term (current) drug therapy: Secondary | ICD-10-CM | POA: Insufficient documentation

## 2018-08-27 DIAGNOSIS — R0981 Nasal congestion: Secondary | ICD-10-CM | POA: Diagnosis present

## 2018-08-27 MED ORDER — DOXYCYCLINE HYCLATE 100 MG PO CAPS: 100.0000 mg | ORAL_CAPSULE | Freq: Two times a day (BID) | ORAL | 0 refills | Status: AC

## 2018-08-27 NOTE — ED Provider Notes (Signed)
[facility] EMERGENCY DEPARTMENT Provider Note   CSN: [id number] Arrival date & time: 08/27/18  1816     History   Chief Complaint Chief Complaint  Patient presents with  . URI    HPI [name] is a 52 y.o. female strong history of anemia, anxiety who presents for evaluation of continued upper respiratory symptoms have been ongoing for last 2 weeks.  Patient reports she was seen here on 08/17/2018 for evaluation of sinus congestion, nasal pressure.  Additionally, at the time, she was having some pressure in her right ear.  At the time, it was suspected that she had a viral URI and was discharged home with prednisone and knees which she states she has been taking.  Patient states she had a brief period where she felt slightly better but then symptoms returned.  She reports pressure to maxillary sinuses bilaterally as well as nasal congestion.  She states she has not noted any fever.  Patient reports that she has not had any difficulty breathing  or chest pain.  She has finished the prednisone and is still intermittently taking the Flonase.  Patient has not tried any other medications.  Patient denies any chest pain, abdominal pain, nausea/vomiting, numbness/weakness of her arms or legs.  The history is provided by the patient.    Past Medical History:  Diagnosis Date  . Anemia   . Anxiety   . Family history of anesthesia complication     Patient Active Problem List   Diagnosis Date Noted  . Leiomyoma 02/04/2013    Past Surgical History:  Procedure Laterality Date  . ABDOMINAL HYSTERECTOMY    . CESAREAN SECTION    . LAPAROSCOPIC ASSISTED VAGINAL HYSTERECTOMY N/A 02/03/2013   Procedure: LAPAROSCOPIC ASSISTED VAGINAL HYSTERECTOMY;  Surgeon: Margarette Asal, MD;  Location: [facility];  Service: Gynecology;  Laterality: N/A;     OB History    Gravida  5   Para  0   Term  0   Preterm  0   AB  0   Living  0     SAB  0   TAB  0   Ectopic  0   Multiple  1   Live Births  0            Home Medications    Prior to Admission medications   Medication Sig Start Date End Date Taking? Authorizing Provider  amLODipine (NORVASC) 2.5 MG tablet Take 2.5 mg by mouth daily.    [provider]  carvedilol (COREG) 12.5 MG tablet Take 12.5 mg by mouth daily.    [provider]  doxycycline (VIBRAMYCIN) 100 MG capsule Take 1 capsule (100 mg total) by mouth 2 (two) times daily for 7 days. 08/27/18 09/03/18  Volanda Napoleon, PA-C  fluticasone (FLONASE) 50 MCG/ACT nasal spray Place 2 sprays into both nostrils daily. 08/17/18   Joy, Shawn C, PA-C  predniSONE (STERAPRED UNI-PAK 21 TAB) 10 MG (21) TBPK tablet Take 6 tabs (60mg ) day 1, 5 tabs (50mg ) day 2, 4 tabs (40mg ) day 3, 3 tabs (30mg ) day 4, 2 tabs (20mg ) day 5, and 1 tab (10mg ) day 6. 08/17/18   Joy, Shawn C, PA-C  Vitamin D, Ergocalciferol, (DRISDOL) 1.25 MG (50000 UT) CAPS capsule Take 50,000 Units by mouth every 7 (seven) days.    [provider]    Family History Family History  Problem Relation Age of Onset  . Diabetes Mother   . Cancer Father   .  Diabetes Sister     Social History Social History   Tobacco Use  . Smoking status: Never Smoker  . Smokeless tobacco: Never Used  Substance Use Topics  . Alcohol use: No  . Drug use: No     Allergies   Percocet [oxycodone-acetaminophen]   Review of Systems Review of Systems  Constitutional: Negative for fever.  HENT: Positive for congestion, ear pain, sinus pressure and sinus pain.   Respiratory: Negative for cough and shortness of breath.   Cardiovascular: Negative for chest pain.  Gastrointestinal: Negative for nausea and vomiting.  All other systems reviewed and are negative.    Physical Exam Updated Vital Signs BP (!) 141/74 (BP Location: Right Arm)   Pulse 81   Temp 98.2 F (36.8 C) (Oral)   Resp 20   Ht 5\' 4"  (1.626 m)   Wt 113.4 kg   LMP 01/15/2013   SpO2 99%   BMI 42.91  kg/m   Physical Exam  Constitutional: She is oriented to person, place, and time. She appears well-developed and well-nourished.  HENT:  Head: Normocephalic and atraumatic.  Right Ear: No mastoid tenderness. Tympanic membrane is bulging. A middle ear effusion is present.  Left Ear: Tympanic membrane normal. No mastoid tenderness.  Nose: Mucosal edema and rhinorrhea present. Right sinus exhibits maxillary sinus tenderness. Left sinus exhibits maxillary sinus tenderness.  Mouth/Throat: Oropharynx is clear and moist and mucous membranes are normal.  Right TM shows small effusion with some bulging.  No overlying erythema to the TM.  Left TM is without any normalities.  Bilateral mastoids are without erythema, warmth, tenderness.  Bilateral nasal turbinates are edematous, erythematous.  Bilateral maxillary sinus pressure and tenderness.  Eyes: Pupils are equal, round, and reactive to light. Conjunctivae, EOM and lids are normal.  Neck: Full passive range of motion without pain.  Cardiovascular: Normal rate, regular rhythm, normal heart sounds and normal pulses. Exam reveals no gallop and no friction rub.  No murmur heard. Pulmonary/Chest: Effort normal and breath sounds normal.  Lungs clear to auscultation bilaterally.  Symmetric chest rise.  No wheezing, rales, rhonchi.  Musculoskeletal: Normal range of motion.  Neurological: She is alert and oriented to person, place, and time.  Cranial nerves III-XII intact Follows commands, Moves all extremities  5/5 strength to BUE and BLE  Sensation intact throughout all major nerve distributions Normal coordination  No slurred speech. No facial droop.   Skin: Skin is warm and dry. Capillary refill takes less than 2 seconds.  Psychiatric: She has a normal mood and affect. Her speech is normal.  Nursing note and vitals reviewed.    ED Treatments / Results  Labs (all labs ordered are listed, but only abnormal results are displayed) Labs Reviewed -  No data to display  EKG None  Radiology No results found.  Procedures Procedures (including critical care time)  Medications Ordered in ED Medications - No data to display   Initial Impression / Assessment and Plan / ED Course  I have reviewed the triage vital signs and the nursing notes.  Pertinent labs & imaging results that were available during my care of the patient were reviewed by me and considered in my medical decision making (see chart for details).      52 y.o. F with PMH/o anxiety, anemia who presents for evaluation of continued upper respiratory symptoms.  Seen here 2 weeks ago for evaluation of upper respiratory symptoms and was read as URI.  She reports she had some slight improvement  in symptoms returned and she continues to have nasal congestion, sinus pressure as well as pressure in her right ear.  No fevers, cough, difficulty breathing. Patient is afebrile, non-toxic appearing, sitting comfortably on examination table. Vital signs reviewed and stable.  On exam, she has bilateral maxillary sinus pressure as well as edematous, erythematous nasal turbinates.  Right TM shows slight middle ear effusion with some bulging TM.  No evidence of erythema.  Left TM is unremarkable.  I suspect that this is most likely a sinus infection as she had a brief area of improvement and then symptoms rebounded.  We will plan to treat with antibiotic's.  Patient tells me she has a history of penicillin allergy and breaks out into hives.  We will plan to treat her with doxycycline. Patient had ample opportunity for questions and discussion. All patient's questions were answered with full understanding. Strict return precautions discussed. Patient expresses understanding and agreement to plan.   Final Clinical Impressions(s) / ED Diagnoses   Final diagnoses:  Acute maxillary sinusitis, recurrence not specified    ED Discharge Orders         Ordered    doxycycline (VIBRAMYCIN) 100 MG capsule   2 times daily     08/27/18 1940           Desma Mcgregor 08/27/18 2018    Veryl Speak, MD 08/27/18 2334

## 2018-08-27 NOTE — Discharge Instructions (Signed)
Take antibiotics as directed. Please take all of your antibiotics until finished.  Continue taking Flonase as directed.  Make sure you are staying hydrated and drink plenty of fluids.  Follow-up with the [facility] to establish primary care.  Return to emergency department for any fevers, difficulty breathing, chest pain or any other worsening or concerning symptoms.

## 2018-08-27 NOTE — ED Triage Notes (Signed)
Pt c/o URi sytmpoms x 2 weeks

## 2018-12-25 ENCOUNTER — Other Ambulatory Visit: Payer: Self-pay

## 2018-12-25 ENCOUNTER — Encounter (HOSPITAL_BASED_OUTPATIENT_CLINIC_OR_DEPARTMENT_OTHER): Payer: Self-pay | Admitting: *Deleted

## 2018-12-25 ENCOUNTER — Emergency Department (HOSPITAL_BASED_OUTPATIENT_CLINIC_OR_DEPARTMENT_OTHER)
Admission: EM | Admit: 2018-12-25 | Discharge: 2018-12-25 | Disposition: A | Discharge status: Home / Self Care | Payer: BC Managed Care – PPO | Attending: Emergency Medicine | Admitting: Emergency Medicine

## 2018-12-25 ENCOUNTER — Emergency Department (HOSPITAL_BASED_OUTPATIENT_CLINIC_OR_DEPARTMENT_OTHER): Payer: BC Managed Care – PPO

## 2018-12-25 DIAGNOSIS — Y92410 Unspecified street and highway as the place of occurrence of the external cause: Secondary | ICD-10-CM | POA: Insufficient documentation

## 2018-12-25 DIAGNOSIS — F419 Anxiety disorder, unspecified: Secondary | ICD-10-CM | POA: Diagnosis not present

## 2018-12-25 DIAGNOSIS — Z79899 Other long term (current) drug therapy: Secondary | ICD-10-CM | POA: Diagnosis not present

## 2018-12-25 DIAGNOSIS — M5442 Lumbago with sciatica, left side: Secondary | ICD-10-CM | POA: Insufficient documentation

## 2018-12-25 DIAGNOSIS — M549 Dorsalgia, unspecified: Secondary | ICD-10-CM

## 2018-12-25 DIAGNOSIS — M545 Low back pain: Secondary | ICD-10-CM | POA: Diagnosis present

## 2018-12-25 MED ORDER — METHOCARBAMOL 500 MG PO TABS: 500.0000 mg | ORAL_TABLET | Freq: Every evening | ORAL | 0 refills | Status: DC | PRN

## 2018-12-25 MED FILL — METHOCARBAMOL 500 MG TABLET: 500 | 10 days supply | Qty: 10 | Fill #0

## 2018-12-25 NOTE — ED Notes (Signed)
NAD at this time. Pt is stable and going home with daughter.
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
## 2018-12-25 NOTE — Discharge Instructions (Signed)
Take ibuprofen 3 times a day with meals.  Do not take other anti-inflammatories at the same time (Advil, Motrin, naproxen, Aleve). You may supplement with Tylenol if you need further pain control. Use robaxin as needed for muscle stiffness or soreness.  Have caution, this may make you tired or groggy.  Do not drive or operate heavy machinery while taking this medicine. Use muscle creams (salonpas, icy hot, bengay) as needed. Do stretches to help relax muscles and help with pain.  Use ice packs or heating pads if this helps control your pain. You will likely have continued muscle stiffness and soreness over the next couple days.  Follow-up with primary care in 1 week if your symptoms are not improving. Return to the emergency room if you develop vision changes, vomiting, slurred speech, numbness, loss of bowel or bladder control, or any new or worsening symptoms.

## 2019-09-30 ENCOUNTER — Other Ambulatory Visit: Payer: Self-pay

## 2019-09-30 ENCOUNTER — Encounter (HOSPITAL_BASED_OUTPATIENT_CLINIC_OR_DEPARTMENT_OTHER): Payer: Self-pay | Admitting: *Deleted

## 2019-09-30 ENCOUNTER — Emergency Department (HOSPITAL_BASED_OUTPATIENT_CLINIC_OR_DEPARTMENT_OTHER)
Admission: EM | Admit: 2019-09-30 | Discharge: 2019-09-30 | Disposition: A | Discharge status: Home / Self Care | Payer: BC Managed Care – PPO | Attending: Emergency Medicine | Admitting: Emergency Medicine

## 2019-09-30 ENCOUNTER — Emergency Department (HOSPITAL_BASED_OUTPATIENT_CLINIC_OR_DEPARTMENT_OTHER): Payer: BC Managed Care – PPO

## 2019-09-30 DIAGNOSIS — M79671 Pain in right foot: Secondary | ICD-10-CM | POA: Diagnosis present

## 2019-09-30 DIAGNOSIS — Z79899 Other long term (current) drug therapy: Secondary | ICD-10-CM | POA: Diagnosis not present

## 2019-09-30 MED ORDER — KETOROLAC TROMETHAMINE 60 MG/2ML IM SOLN
60.0000 mg | Freq: Once | INTRAMUSCULAR | Status: AC
  Administered 2019-09-30: 60 mg via INTRAMUSCULAR
  Filled 2019-09-30: qty 2

## 2019-09-30 NOTE — ED Provider Notes (Signed)
[facility] EMERGENCY DEPARTMENT Provider Note   CSN: [id number] Arrival date & time: 09/30/19  1950     History Chief Complaint  Patient presents with  . Foot Pain    [name] is a 53 y.o. female.  The history is provided by the patient.  Foot Pain This is a new problem. The current episode started more than 1 week ago. The problem occurs daily. The problem has not changed since onset.Pertinent negatives include no chest pain, no abdominal pain and no shortness of breath. The symptoms are aggravated by walking. Nothing relieves the symptoms. She has tried acetaminophen for the symptoms. The treatment provided mild relief.       Past Medical History:  Diagnosis Date  . Anemia   . Anxiety   . Family history of anesthesia complication     Patient Active Problem List   Diagnosis Date Noted  . Leiomyoma 02/04/2013    Past Surgical History:  Procedure Laterality Date  . ABDOMINAL HYSTERECTOMY    . CESAREAN SECTION    . LAPAROSCOPIC ASSISTED VAGINAL HYSTERECTOMY N/A 02/03/2013   Procedure: LAPAROSCOPIC ASSISTED VAGINAL HYSTERECTOMY;  Surgeon: Margarette Asal, MD;  Location: [facility];  Service: Gynecology;  Laterality: N/A;     OB History    Gravida  5   Para  0   Term  0   Preterm  0   AB  0   Living  0     SAB  0   TAB  0   Ectopic  0   Multiple  1   Live Births  0           Family History  Problem Relation Age of Onset  . Diabetes Mother   . Cancer Father   . Diabetes Sister     Social History   Tobacco Use  . Smoking status: Never Smoker  . Smokeless tobacco: Never Used  Substance Use Topics  . Alcohol use: No  . Drug use: No    Home Medications Prior to Admission medications   Medication Sig Start Date End Date Taking? Authorizing Provider  amLODipine (NORVASC) 2.5 MG tablet Take 2.5 mg by mouth daily.    [provider]  carvedilol (COREG) 12.5 MG tablet Take 12.5 mg by mouth daily.    [provider]  fluticasone (FLONASE) 50 MCG/ACT nasal spray Place 2 sprays into both nostrils daily. 08/17/18   Joy, Shawn C, PA-C  hydrochlorothiazide (MICROZIDE) 12.5 MG capsule Take by mouth daily. 12/14/18   [provider]  methocarbamol (ROBAXIN) 500 MG tablet Take 1 tablet (500 mg total) by mouth at bedtime as needed. 12/25/18   Caccavale, Sophia, PA-C  predniSONE (STERAPRED UNI-PAK 21 TAB) 10 MG (21) TBPK tablet Take 6 tabs (60mg ) day 1, 5 tabs (50mg ) day 2, 4 tabs (40mg ) day 3, 3 tabs (30mg ) day 4, 2 tabs (20mg ) day 5, and 1 tab (10mg ) day 6. 08/17/18   Joy, Shawn C, PA-C  Vitamin D, Ergocalciferol, (DRISDOL) 1.25 MG (50000 UT) CAPS capsule Take 50,000 Units by mouth every 7 (seven) days.    [provider]    Allergies    Percocet [oxycodone-acetaminophen]  Review of Systems   Review of Systems  Constitutional: Negative for chills and fever.  HENT: Negative for ear pain and sore throat.   Eyes: Negative for pain and visual disturbance.  Respiratory: Negative for cough and shortness of breath.   Cardiovascular: Negative for chest pain and palpitations.  Gastrointestinal: Negative for abdominal pain and vomiting.  Genitourinary: Negative for dysuria and hematuria.  Musculoskeletal: Positive for arthralgias. Negative for back pain.  Skin: Negative for color change and rash.  Neurological: Positive for numbness. Negative for seizures and syncope.  All other systems reviewed and are negative.   Physical Exam  ED Triage Vitals  Enc Vitals Group     BP 09/30/19 1958 (!) 154/100     Pulse Rate 09/30/19 1958 85     Resp 09/30/19 1958 16     Temp 09/30/19 1958 98.5 F (36.9 C)     Temp src --      SpO2 09/30/19 1958 98 %     Weight 09/30/19 1954 250 lb (113.4 kg)     Height 09/30/19 1954 5\' 4"  (1.626 m)     Head Circumference --      Peak Flow --      Pain Score 09/30/19 1954 8     Pain Loc --      Pain Edu? --      Excl. in [place]? --     Physical  Exam Vitals and nursing note reviewed.  Constitutional:      General: She is not in acute distress.    Appearance: She is well-developed. She is not ill-appearing.  HENT:     Head: Normocephalic and atraumatic.     Nose: Nose normal.     Mouth/Throat:     Mouth: Mucous membranes are moist.  Eyes:     Extraocular Movements: Extraocular movements intact.     Conjunctiva/sclera: Conjunctivae normal.     Pupils: Pupils are equal, round, and reactive to light.  Cardiovascular:     Rate and Rhythm: Normal rate and regular rhythm.     Pulses: Normal pulses.     Heart sounds: Normal heart sounds. No murmur.  Pulmonary:     Effort: Pulmonary effort is normal. No respiratory distress.     Breath sounds: Normal breath sounds.  Abdominal:     General: Abdomen is flat.     Palpations: Abdomen is soft.     Tenderness: There is no abdominal tenderness.  Musculoskeletal:        General: Tenderness (TTP to right midfoot/medial side) present. Normal range of motion.     Cervical back: Normal range of motion and neck supple.     Right lower leg: No edema.     Left lower leg: No edema.  Skin:    General: Skin is warm and dry.     Capillary Refill: Capillary refill takes less than 2 seconds.  Neurological:     General: No focal deficit present.     Mental Status: She is alert.     Sensory: No sensory deficit.     Motor: No weakness.  Psychiatric:        Mood and Affect: Mood normal.     ED Results / Procedures / Treatments   Labs (all labs ordered are listed, but only abnormal results are displayed) Labs Reviewed - No data to display  EKG None  Radiology DG Foot Complete Right  Result Date: 09/30/2019 CLINICAL DATA:  Pain for EXAM: RIGHT FOOT COMPLETE - 3+ VIEW COMPARISON:  None. FINDINGS: There is no evidence of fracture or dislocation. There is no evidence of arthropathy or other focal bone abnormality. Soft tissues are unremarkable. Small calcaneal enthesophyte is seen.  IMPRESSION: Negative. Electronically Signed   By: Prudencio Pair M.D.   On: 09/30/2019 20:52  Procedures Procedures (including critical care time)  Medications Ordered in ED Medications - No data to display  ED Course  I have reviewed the triage vital signs and the nursing notes.  Pertinent labs & imaging results that were available during my care of the patient were reviewed by me and considered in my medical decision making (see chart for details).    MDM Rules/Calculators/A&P                       [name] is a 53 year old female history of anxiety who presents to the ED with right foot pain.  Has had some burning type pain to the bottom of her right foot for the past week.  Has used some over-the-counter medications with some relief.  Tenderness mostly to the medial midfoot/calcaneal area.  She has normal strength and sensation on exam.  Good pulses in bilateral legs.  No obvious edema.  X-ray showed no acute findings.  Did show small calcaneal enthesophyte that could be cause of her pain.  Recommend continued use of Tylenol, Motrin and follow-up with her primary care doctor.  Discharged from ED in good condition.  Given return precautions.  This chart was dictated using voice recognition software.  Despite best efforts to proofread,  errors can occur which can change the documentation meaning.     Final Clinical Impression(s) / ED Diagnoses Final diagnoses:  Foot pain, right    Rx / DC Orders ED Discharge Orders    None       Lennice Sites, DO 09/30/19 2106

## 2019-09-30 NOTE — ED Triage Notes (Signed)
pt c/o bil feet pain x 1 week

## 2019-11-01 IMAGING — CR DG CHEST 2V
2 series · 2 of 2 positions shown · non-contrast
Comparison: Prior radiograph from 07/31/2017.

CLINICAL DATA: Initial evaluation for acute right upper back pain
radiating into neck.

EXAM:
CHEST - 2 VIEW

[w chest pa]
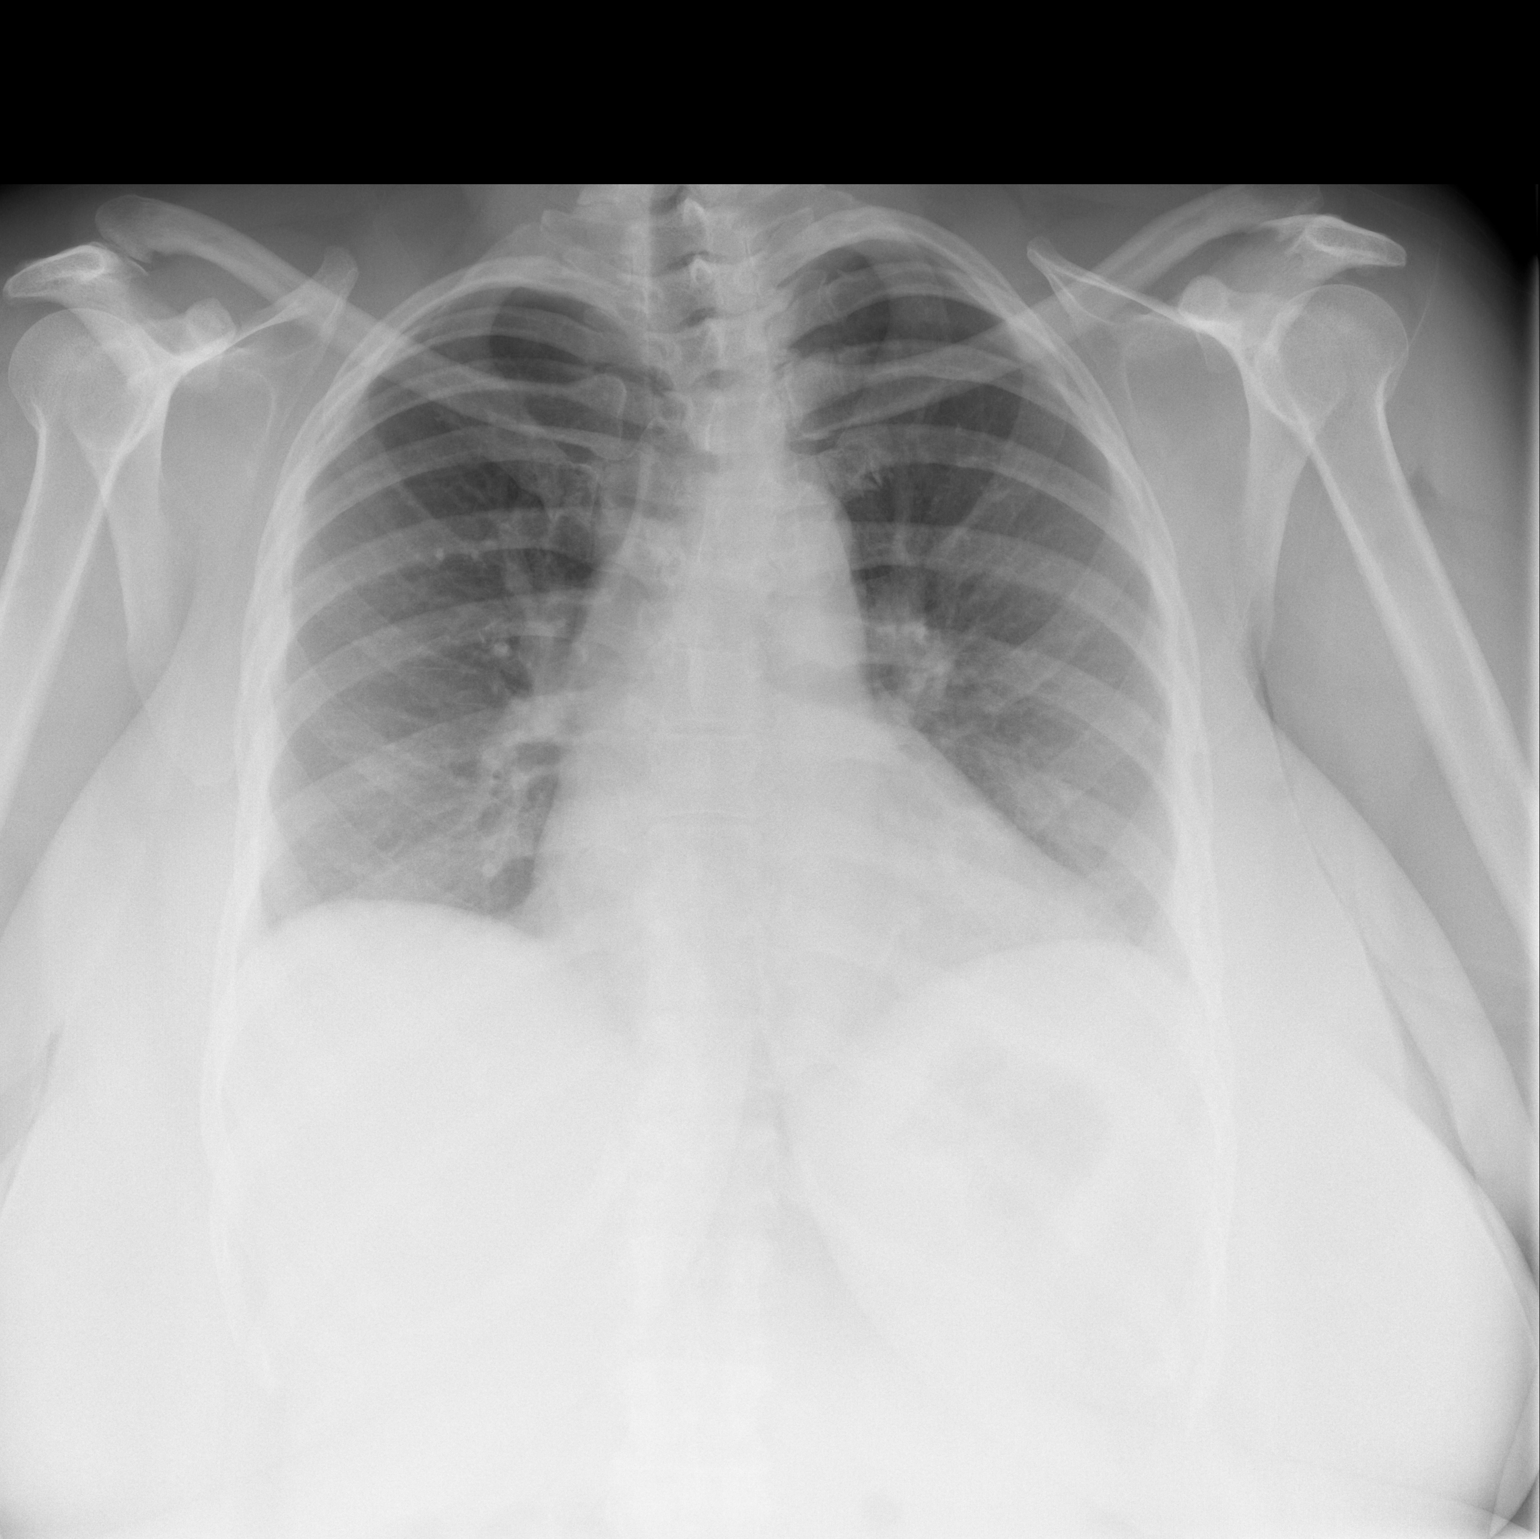

[w chest lat]
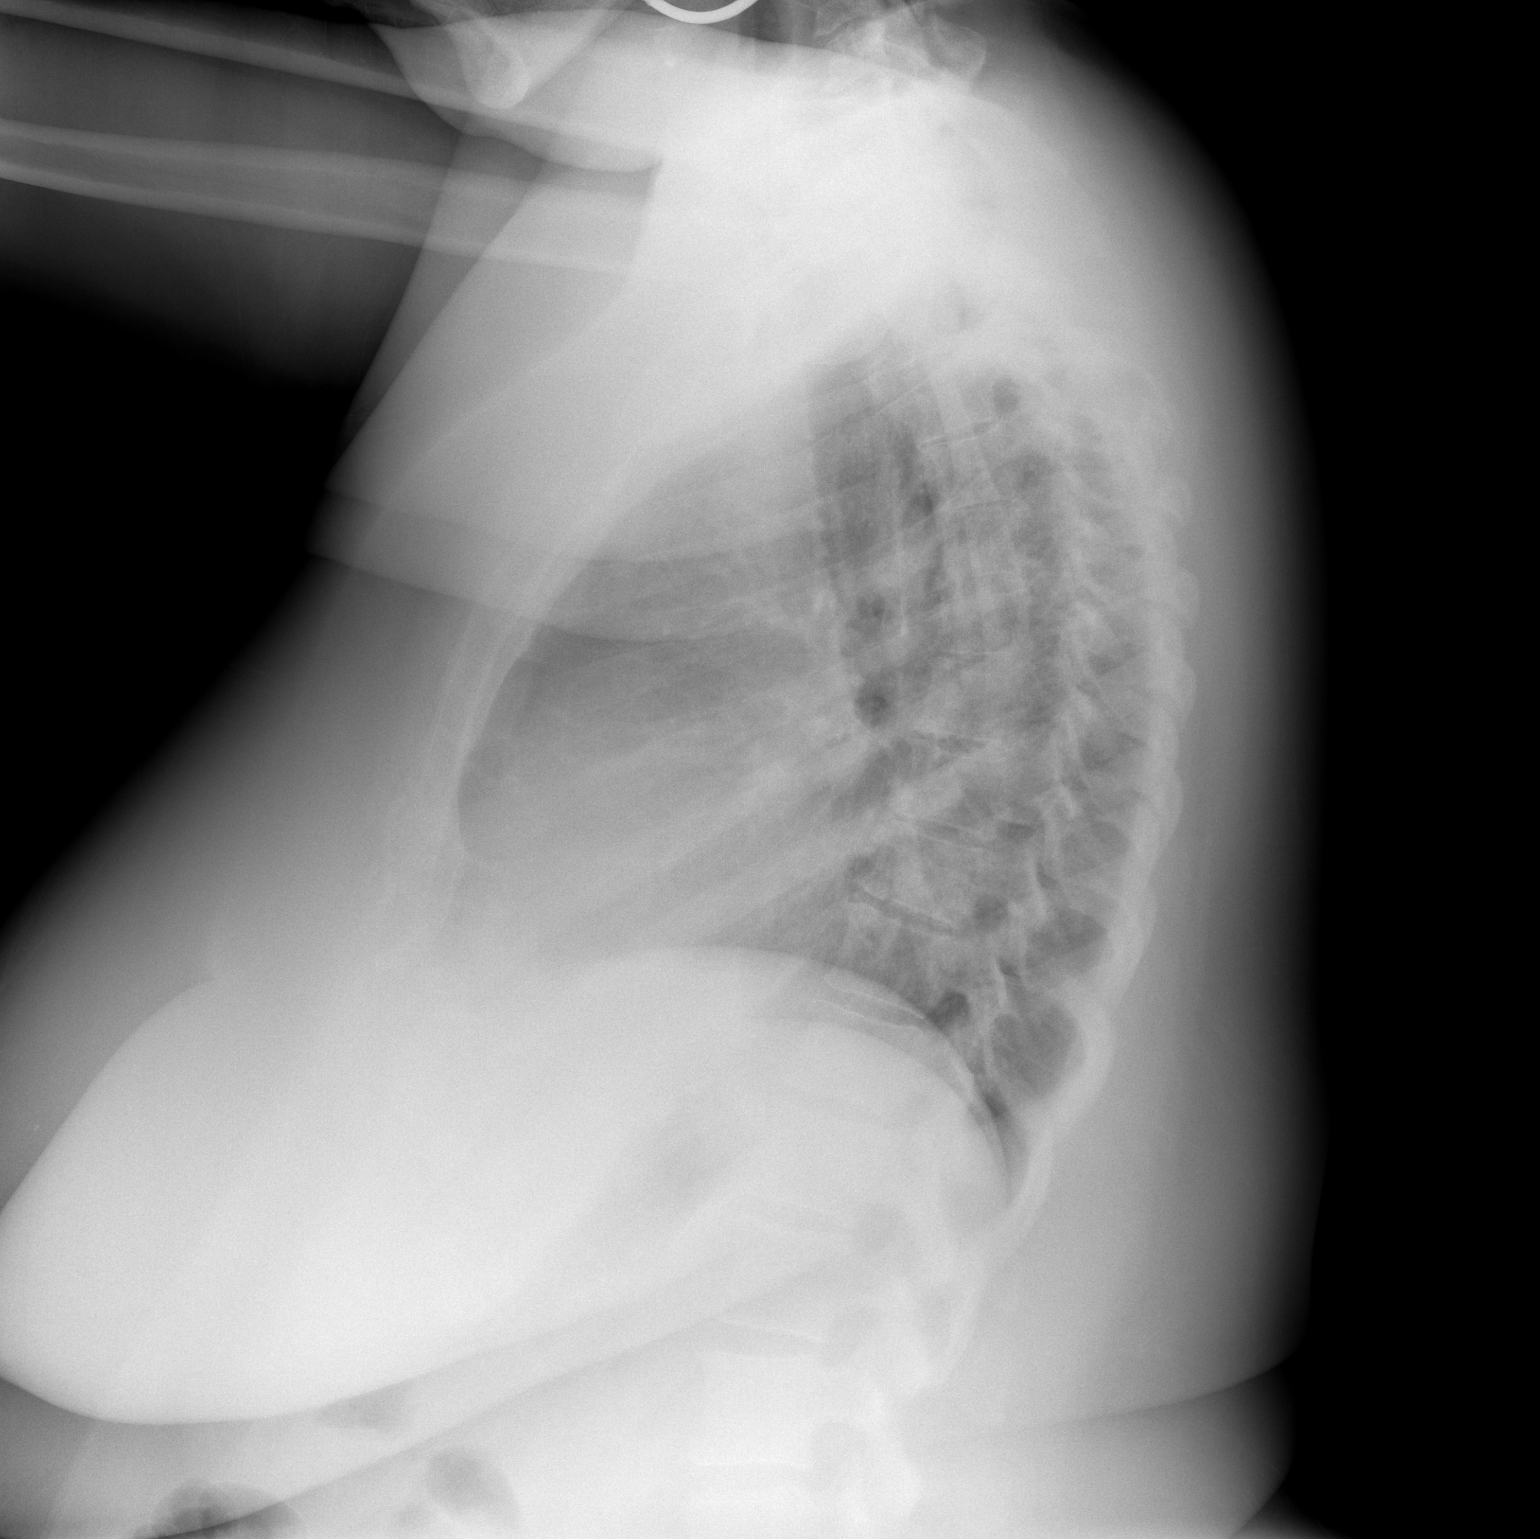

[2 of 2 positions shown; findings below may reference images not displayed]

FINDINGS: The cardiac and mediastinal silhouettes are stable in size and
contour, and remain within normal limits.

The lungs are normally inflated. No airspace consolidation, pleural
effusion, or pulmonary edema is identified. There is no
pneumothorax.

No acute osseous abnormality identified.
IMPRESSION: No active cardiopulmonary disease.

## 2020-05-10 ENCOUNTER — Ambulatory Visit: Payer: BC Managed Care – PPO | Attending: Critical Care Medicine

## 2020-05-10 DIAGNOSIS — Z23 Encounter for immunization: Secondary | ICD-10-CM

## 2020-05-10 NOTE — Progress Notes (Signed)
   Covid-19 Vaccination Clinic  Name:  MAYANNA GARLITZ    MRN: [id number] DOB: 1966/05/02  05/10/2020  Ms. Angelini was observed post Covid-19 immunization for 15 minutes without incident. She was provided with Vaccine Information Sheet and instruction to access the V-Safe system.   Ms. Quarry was instructed to call 911 with any severe reactions post vaccine: Marland Kitchen Difficulty breathing  . Swelling of face and throat  . A fast heartbeat  . A bad rash all over body  . Dizziness and weakness   Immunizations Administered    Name Date Dose VIS Date Route   Moderna COVID-19 Vaccine 05/10/2020 12:54 PM 0.5 mL 09/2019 Intramuscular   Manufacturer: Moderna   Lot: [id number]   [place]: [id number]

## 2020-06-07 ENCOUNTER — Ambulatory Visit: Payer: BC Managed Care – PPO | Attending: Critical Care Medicine

## 2020-06-07 DIAGNOSIS — Z23 Encounter for immunization: Secondary | ICD-10-CM

## 2020-06-07 NOTE — Progress Notes (Signed)
   Covid-19 Vaccination Clinic  Name:  [name]    MRN: [id number] DOB: 1966/01/27  06/07/2020  Ms. Lickteig was observed post Covid-19 immunization for 15 minutes without incident. She was provided with Vaccine Information Sheet and instruction to access the V-Safe system.   Ms. Quigley was instructed to call 911 with any severe reactions post vaccine: Marland Kitchen Difficulty breathing  . Swelling of face and throat  . A fast heartbeat  . A bad rash all over body  . Dizziness and weakness   Immunizations Administered    Name Date Dose VIS Date Route   Moderna COVID-19 Vaccine 06/07/2020  9:06 AM 0.5 mL 09/2019 Intramuscular   Manufacturer: Moderna   Lot: [id number]   [place]: [id number]

## 2021-05-23 ENCOUNTER — Ambulatory Visit (INDEPENDENT_AMBULATORY_CARE_PROVIDER_SITE_OTHER): Payer: Self-pay | Admitting: Family Medicine

## 2021-05-23 DIAGNOSIS — R5383 Other fatigue: Secondary | ICD-10-CM

## 2021-05-23 DIAGNOSIS — Z1331 Encounter for screening for depression: Secondary | ICD-10-CM

## 2021-05-23 DIAGNOSIS — R0602 Shortness of breath: Secondary | ICD-10-CM

## 2021-06-06 ENCOUNTER — Ambulatory Visit (INDEPENDENT_AMBULATORY_CARE_PROVIDER_SITE_OTHER): Payer: Self-pay | Admitting: Family Medicine

## 2021-06-06 ENCOUNTER — Ambulatory Visit (INDEPENDENT_AMBULATORY_CARE_PROVIDER_SITE_OTHER): Payer: Self-pay | Admitting: Bariatrics

## 2021-06-06 DIAGNOSIS — R0602 Shortness of breath: Secondary | ICD-10-CM

## 2021-06-06 DIAGNOSIS — R5383 Other fatigue: Secondary | ICD-10-CM

## 2021-06-06 DIAGNOSIS — Z1331 Encounter for screening for depression: Secondary | ICD-10-CM

## 2021-06-06 NOTE — Progress Notes (Signed)
No show for new pt appt

## 2021-06-20 ENCOUNTER — Ambulatory Visit (INDEPENDENT_AMBULATORY_CARE_PROVIDER_SITE_OTHER): Payer: Self-pay | Admitting: Bariatrics

## 2022-05-16 ENCOUNTER — Encounter (INDEPENDENT_AMBULATORY_CARE_PROVIDER_SITE_OTHER): Payer: Self-pay

## 2022-09-05 ENCOUNTER — Other Ambulatory Visit: Payer: Self-pay

## 2022-09-05 ENCOUNTER — Emergency Department
Admission: EM | Admit: 2022-09-05 | Discharge: 2022-09-05 | Disposition: A | Discharge status: Home / Self Care | Payer: BC Managed Care – PPO | Attending: Emergency Medicine | Admitting: Emergency Medicine

## 2022-09-05 DIAGNOSIS — J069 Acute upper respiratory infection, unspecified: Secondary | ICD-10-CM | POA: Diagnosis not present

## 2022-09-05 DIAGNOSIS — Z20822 Contact with and (suspected) exposure to covid-19: Secondary | ICD-10-CM | POA: Insufficient documentation

## 2022-09-05 DIAGNOSIS — R519 Headache, unspecified: Secondary | ICD-10-CM | POA: Diagnosis present

## 2022-09-05 LAB — RESP PANEL BY RT-PCR (FLU A&B, COVID) ARPGX2
Influenza A by PCR: NEGATIVE
Influenza B by PCR: NEGATIVE
SARS Coronavirus 2 by RT PCR: NEGATIVE

## 2022-09-05 MED ORDER — AZITHROMYCIN 250 MG PO TABS: ORAL_TABLET | ORAL | 0 refills | Status: DC

## 2022-09-05 MED ORDER — BENZONATATE 100 MG PO CAPS: 100.0000 mg | ORAL_CAPSULE | Freq: Three times a day (TID) | ORAL | 0 refills | Status: AC | PRN

## 2022-09-05 MED ORDER — AMOXICILLIN 875 MG PO TABS: 875.0000 mg | ORAL_TABLET | Freq: Two times a day (BID) | ORAL | 0 refills | Status: DC

## 2022-09-05 NOTE — ED Triage Notes (Signed)
Pt here with a headache and congestion since yesterday. Pt denies fever at home but states she feels terrible. Pt denies N/V/D.

## 2022-09-05 NOTE — ED Provider Notes (Signed)
[facility] Provider Note    Event Date/Time   First MD Initiated Contact with Patient 09/05/22 1421     (approximate)   History   Headache   HPI  [name] is a 56 y.o. female with history of anemia presents emergency department complaining of a headache and congestion since yesterday.  Some sore throat that started last week along with a cough.  States no fever that she knows of.  No vomiting or diarrhea.  No chest pain or shortness of breath      Physical Exam   Triage Vital Signs: ED Triage Vitals  Enc Vitals Group     BP 09/05/22 1258 (!) 178/99     Pulse Rate 09/05/22 1258 87     Resp 09/05/22 1258 18     Temp 09/05/22 1258 98.7 F (37.1 C)     Temp Source 09/05/22 1258 Oral     SpO2 09/05/22 1258 98 %     Weight 09/05/22 1258 205 lb (93 kg)     Height 09/05/22 1258 '5\' 4"'$  (1.626 m)     Head Circumference --      Peak Flow --      Pain Score 09/05/22 1306 10     Pain Loc --      Pain Edu? --      Excl. in [place]? --     Most recent vital signs: Vitals:   09/05/22 1258  BP: (!) 178/99  Pulse: 87  Resp: 18  Temp: 98.7 F (37.1 C)  SpO2: 98%     General: Awake, no distress.   CV:  Good peripheral perfusion. regular rate and  rhythm Resp:  Normal effort. Lungs CTA Abd:  No distention.   Other:  Frontal sinus tender to palpation   ED Results / Procedures / Treatments   Labs (all labs ordered are listed, but only abnormal results are displayed) Labs Reviewed  RESP PANEL BY RT-PCR (FLU A&B, COVID) [id number]     EKG     RADIOLOGY     PROCEDURES:   Procedures   MEDICATIONS ORDERED IN ED: Medications - No data to display   IMPRESSION / MDM / [place] / ED COURSE  I reviewed the triage vital signs and the nursing notes.                              Differential diagnosis includes, but is not limited to, COVID, influenza, URI  Patient's presentation is most consistent with acute complicated  illness / injury requiring diagnostic workup.   Patient's respiratory panel is reassuring  Explained the findings to the patient.  She is placed on amoxicillin and Tessalon Perles.  She is to follow-up with her regular doctor if not improving 3 days.  Return emergency department worsening.  She was given a work note and discharged stable condition.      FINAL CLINICAL IMPRESSION(S) / ED DIAGNOSES   Final diagnoses:  Acute URI     Rx / DC Orders   ED Discharge Orders          Ordered    amoxicillin (AMOXIL) 875 MG tablet  2 times daily        09/05/22 1430    benzonatate (TESSALON PERLES) 100 MG capsule  3 times daily PRN        09/05/22 1430  Note:  This document was prepared using Dragon voice recognition software and may include unintentional dictation errors.    Versie Starks, PA-C 09/05/22 1433    Arta Silence, MD 09/05/22 1526

## 2022-09-05 NOTE — Discharge Instructions (Signed)
Your COVID and your flu test are both negative.  Please take medication as prescribed.  Return emergency department worsening.  Tylenol and ibuprofen for headache.  Also for fever if needed.  Drink plenty of fluids.

## 2023-01-17 ENCOUNTER — Other Ambulatory Visit (HOSPITAL_COMMUNITY): Payer: Self-pay

## 2023-01-17 MED ORDER — WEGOVY 0.25 MG/0.5ML ~~LOC~~ SOAJ
0.2500 mg | SUBCUTANEOUS | 0 refills | Status: DC
  Filled 2023-01-17 – 2023-01-25 (×3): qty 2, 28d supply, fill #0

## 2023-01-25 ENCOUNTER — Other Ambulatory Visit (HOSPITAL_COMMUNITY): Payer: Self-pay

## 2024-07-27 ENCOUNTER — Other Ambulatory Visit: Payer: Self-pay

## 2024-07-27 ENCOUNTER — Emergency Department
Admission: EM | Admit: 2024-07-27 | Discharge: 2024-07-27 | Disposition: A | Discharge status: Home / Self Care | Attending: Emergency Medicine | Admitting: Emergency Medicine

## 2024-07-27 ENCOUNTER — Emergency Department

## 2024-07-27 DIAGNOSIS — J069 Acute upper respiratory infection, unspecified: Secondary | ICD-10-CM | POA: Diagnosis not present

## 2024-07-27 DIAGNOSIS — I447 Left bundle-branch block, unspecified: Secondary | ICD-10-CM | POA: Diagnosis not present

## 2024-07-27 DIAGNOSIS — I10 Essential (primary) hypertension: Secondary | ICD-10-CM | POA: Diagnosis present

## 2024-07-27 LAB — RESP PANEL BY RT-PCR (RSV, FLU A&B, COVID)  RVPGX2
Influenza A by PCR: NEGATIVE
Influenza B by PCR: NEGATIVE
Resp Syncytial Virus by PCR: NEGATIVE
SARS Coronavirus 2 by RT PCR: NEGATIVE

## 2024-07-27 LAB — TROPONIN I (HIGH SENSITIVITY)
Troponin I (High Sensitivity): 6 ng/L (ref <18)
Troponin I (High Sensitivity): 6 ng/L (ref <18)

## 2024-07-27 LAB — CBC
HCT: 39.4 % (ref 36.0–46.0)
Hemoglobin: 12.5 g/dL (ref 12.0–15.0)
MCH: 28.2 pg (ref 26.0–34.0)
MCHC: 31.7 g/dL (ref 30.0–36.0)
MCV: 88.9 fL (ref 80.0–100.0)
Platelets: 359 K/uL (ref 150–400)
RBC: 4.43 MIL/uL (ref 3.87–5.11)
RDW: 14.8 % (ref 11.5–15.5)
WBC: 7.8 K/uL (ref 4.0–10.5)
nRBC: 0 % (ref 0.0–0.2)

## 2024-07-27 LAB — BASIC METABOLIC PANEL WITH GFR
Anion gap: 13 (ref 5–15)
BUN: 11 mg/dL (ref 6–20)
CO2: 26 mmol/L (ref 22–32)
Calcium: 8.9 mg/dL (ref 8.9–10.3)
Chloride: 103 mmol/L (ref 98–111)
Creatinine, Ser: 0.85 mg/dL (ref 0.44–1.00)
GFR, Estimated: >60 mL/min (ref >60)
Glucose, Bld: 161 mg/dL — ABNORMAL HIGH (ref 70–99)
Potassium: 3.8 mmol/L (ref 3.5–5.1)
Sodium: 142 mmol/L (ref 135–145)

## 2024-07-27 MED ORDER — ALBUTEROL SULFATE HFA 108 (90 BASE) MCG/ACT IN AERS: 2.0000 | INHALATION_SPRAY | RESPIRATORY_TRACT | 1 refills | Status: AC | PRN

## 2024-07-27 MED ORDER — FLUTICASONE PROPIONATE 50 MCG/ACT NA SUSP: 1.0000 | Freq: Every day | NASAL | 0 refills | Status: AC

## 2024-07-27 MED ORDER — GUAIFENESIN-CODEINE 100-10 MG/5ML PO SOLN: 10.0000 mL | Freq: Three times a day (TID) | ORAL | 0 refills | Status: AC | PRN

## 2024-07-27 NOTE — Discharge Instructions (Addendum)
 Follow-up with your primary care provider if your upper respiratory symptoms are not improving over the week.  Schedule an appointment to see cardiology.  A referral has been entered today.  You should receive a phone call to schedule an appointment.  In the meantime, if you develop any symptoms of concern such as dizziness, chest pain, shortness of breath you should return to the emergency department for evaluation.

## 2024-07-27 NOTE — ED Provider Notes (Signed)
 [facility] Provider Note    Event Date/Time   First MD Initiated Contact with Patient 07/27/24 1536     (approximate)   History   No chief complaint on file.   HPI  [name] is a 58 y.o. female with history of anemia, leiomyoma with hysterectomy, hypertension and as listed in EMR presents to the emergency department for treatment evaluation of hypertension.  She was evaluated by her primary care provider today and was hypertensive in the office and was sent to the emergency department.  Patient has not taken her blood pressure medications today.     Physical Exam    Vitals:   07/27/24 1540 07/27/24 1637  BP:  (!) 202/95  Pulse:  74  Resp:  18  Temp:    SpO2: 100% 100%    General: Awake, no distress.  CV:  Good peripheral perfusion.  Resp:  Normal effort. Breath sounds clear. Abd:  No distention.  Other:  Bilateral TM injected without erythema   ED Results / Procedures / Treatments   Labs (all labs ordered are listed, but only abnormal results are displayed)  Labs Reviewed  BASIC METABOLIC PANEL WITH GFR - Abnormal; Notable for the following components:      Result Value   Glucose, Bld 161 (*)    All other components within normal limits  RESP PANEL BY RT-PCR (RSV, FLU A&B, COVID)  RVPGX2  CBC  TROPONIN I (HIGH SENSITIVITY)  TROPONIN I (HIGH SENSITIVITY)     EKG  Normal sinus rhythm with a rate of 74 with left bundle branch block.   RADIOLOGY  Image and radiology report reviewed and interpreted by me. Radiology report consistent with the same.  Chest x-ray negative for acute cardiopulmonary abnormality.  PROCEDURES:  Critical Care performed: No  Procedures   MEDICATIONS ORDERED IN ED:  Medications - No data to display   IMPRESSION / MDM / ASSESSMENT AND PLAN / ED COURSE   I have reviewed the triage note and vital signs. Vital signs are stable   Differential diagnosis includes, but is not limited to,  URI, COVID, RSV, influenza, pneumonia.  Uncontrolled hypertension, hypertensive urgency  Patient's presentation is most consistent with acute presentation with potential threat to life or bodily function.  58 year old female presenting to the emergency department for treatment and evaluation of hypertension and URI symptoms.  See HPI for further details.  Patient states that she had gone to her primary care provider for body aches, sinus pressure that started yesterday but was not evaluated there due to hypertension.  She has not yet taken her blood pressure medications that she typically takes in the morning.  She denies headache, dizziness, chest pain, or shortness of breath.  She reports having COVID about 2 weeks ago but felt that she had completely recovered until 2 days ago when she developed body aches and sinus pressure.   Labs are overall reassuring.  CBC is normal and BMP is normal.  Troponin is 6.  Respiratory panel is negative. EKG shows a left bundle branch block.  She does not have any recent EKGs for comparison.  The last 1 that I can see is from 2019.  She did not have a left bundle branch at that time.  I do not feel that this is an acute finding as she has no chest pain or shortness of breath and troponin is normal.  Results discussed with the patient including the EKG.  Plan will be to  treat her URI symptoms and consult cardiology for follow-up.  We discussed reasons to return to the emergency department.  Plan will be to discharge her home with Robitussin AC for the cough and congestion and fluticasone  for the sinus pressure.  She was encouraged to see primary care for URI symptoms that are not improving over the next few days.  In regard to blood pressure, she was instructed to take her medication when she gets home and daily as prescribed.  Patient is comfortable and agreeable to this plan.      FINAL CLINICAL IMPRESSION(S) / ED DIAGNOSES   Final diagnoses:  Uncontrolled  hypertension  Left bundle branch block  Acute upper respiratory infection     Rx / DC Orders   ED Discharge Orders          Ordered    Ambulatory referral to Cardiology       Comments: If you have not heard from the Cardiology office within the next 72 hours please call [phone].   07/27/24 1637    albuterol  (VENTOLIN  HFA) 108 (90 Base) MCG/ACT inhaler  Every 4 hours PRN        07/27/24 1648    guaiFENesin -codeine  100-10 MG/5ML syrup  3 times daily PRN        07/27/24 1648    fluticasone  (FLONASE ) 50 MCG/ACT nasal spray  Daily        07/27/24 1648             Note:  This document was prepared using Dragon voice recognition software and may include unintentional dictation errors.   Herlinda Kirk NOVAK, FNP 07/27/24 2231    Claudene Rover, MD 07/27/24 2302

## 2024-07-27 NOTE — ED Triage Notes (Signed)
 Pt comes in via pov with complaints of body aches, and sinus pressure that started yesterday. Pt was diagnosed with covid 2 weeks ago. Pt was seen at her primary care providers office today, where she was hypertensive and sent to the ER for further evaluation. Pt in triage with a BP of 203/101.  Patient complains of bodyaches 9/10 at this time. Pt has a history of hypertension, but has not had her BP medication today.

## 2024-07-31 ENCOUNTER — Ambulatory Visit

## 2024-07-31 ENCOUNTER — Other Ambulatory Visit: Payer: Self-pay

## 2024-07-31 VITALS — BP 168/110 | HR 80 | Ht 64.0 in | Wt 257.6 lb

## 2024-07-31 DIAGNOSIS — R072 Precordial pain: Secondary | ICD-10-CM

## 2024-07-31 DIAGNOSIS — R0609 Other forms of dyspnea: Secondary | ICD-10-CM | POA: Insufficient documentation

## 2024-07-31 DIAGNOSIS — Z8489 Family history of other specified conditions: Secondary | ICD-10-CM | POA: Insufficient documentation

## 2024-07-31 DIAGNOSIS — F419 Anxiety disorder, unspecified: Secondary | ICD-10-CM | POA: Insufficient documentation

## 2024-07-31 DIAGNOSIS — D649 Anemia, unspecified: Secondary | ICD-10-CM | POA: Insufficient documentation

## 2024-07-31 DIAGNOSIS — I10 Essential (primary) hypertension: Secondary | ICD-10-CM

## 2024-07-31 DIAGNOSIS — I447 Left bundle-branch block, unspecified: Secondary | ICD-10-CM

## 2024-07-31 HISTORY — DX: Left bundle-branch block, unspecified: I44.7

## 2024-07-31 HISTORY — DX: Other forms of dyspnea: R06.09

## 2024-07-31 HISTORY — DX: Essential (primary) hypertension: I10

## 2024-07-31 MED ORDER — HYDROCHLOROTHIAZIDE 25 MG PO TABS: 25.0000 mg | ORAL_TABLET | Freq: Every day | ORAL | 3 refills | Status: AC

## 2024-07-31 MED ORDER — METOPROLOL TARTRATE 100 MG PO TABS: ORAL_TABLET | ORAL | 0 refills | Status: AC

## 2024-07-31 MED ORDER — CARVEDILOL 3.125 MG PO TABS: 3.1250 mg | ORAL_TABLET | Freq: Two times a day (BID) | ORAL | 3 refills | Status: AC

## 2024-07-31 NOTE — Assessment & Plan Note (Signed)
 New diagnosis.  Recently identified at ER visit October 2025.  Prior EKG for comparison from 2019 showed narrow QRS.  Etiology could be hypertensive heart disease related versus ischemia.  Will proceed with echocardiogram as reviewed above and hypertension. Given her symptoms of shortness of breath with exertion which are relatively chronic but progressive and now with LBBB morphology we will proceed with cardiac CT coronary angiogram to rule out any significant obstructive CAD.

## 2024-07-31 NOTE — Patient Instructions (Addendum)
 Medication Instructions:   Carvedilol 3.125 1 tablet twice daily  INCREASE: hydrochlorothiazide 25mg  1 tablet daily  Take: Metoprolol 100mg  1 tablet 2 hours prior to CT scan   Lab Work: None Ordered If you have labs (blood work) drawn today and your tests are completely normal, you will receive your results only by: [employer] (if you have MyChart) OR A paper copy in the mail If you have any lab test that is abnormal or we need to change your treatment, we will call you to review the results.   Testing/Procedures:   Your cardiac CT will be scheduled at one of the below locations:   [facility] [address]  [place], [place] [address] Opening February 03, 2024    If scheduled at the Heart and Vascular Tower at [facility], please enter the parking lot using the [address] street entrance and use the FREE valet service at the patient drop-off area. Enter the buidling and check-in with registration on the main floor.    Please follow these instructions carefully (unless otherwise directed):   On the Night Before the Test: Be sure to Drink plenty of water. Do not consume any caffeinated/decaffeinated beverages or chocolate 12 hours prior to your test. Do not take any antihistamines 12 hours prior to your test.   On the Day of the Test: Drink plenty of water until 1 hour prior to the test. Do not eat any food 1 hour prior to test. You may take your regular medications prior to the test.  Take metoprolol (Lopressor) two hours prior to test. If you take Furosemide/Hydrochlorothiazide/Spironolactone/Chlorthalidone, please HOLD on the morning of the test. Patients who wear a continuous glucose monitor MUST remove the device prior to scanning. FEMALES- please wear underwire-free bra if available, avoid dresses & tight clothing  After the Test: Drink plenty of water. After receiving IV contrast, you may experience a mild flushed feeling.  This is normal. On occasion, you may experience a mild rash up to 24 hours after the test. This is not dangerous. If this occurs, you can take Benadryl  25 mg, Zyrtec, Claritin, or Allegra and increase your fluid intake. (Patients taking Tikosyn should avoid Benadryl , and may take Zyrtec, Claritin, or Allegra) If you experience trouble breathing, this can be serious. If it is severe call 911 IMMEDIATELY. If it is mild, please call our office.  We will call to schedule your test 2-4 weeks out understanding that some insurance companies will need an authorization prior to the service being performed.   For more information and frequently asked questions, please visit our website : [email or web address]  For non-scheduling related questions, please contact the cardiac imaging nurse navigator should you have any questions/concerns: Cardiac Imaging Nurse Navigators Direct Office Dial: [phone]   For scheduling needs, including cancellations and rescheduling, please call [place], [phone].   Your physician has requested that you have an echocardiogram. Echocardiography is a painless test that uses sound waves to create images of your heart. It provides your doctor with information about the size and shape of your heart and how well your heart's chambers and valves are working. This procedure takes approximately one hour. There are no restrictions for this procedure. Please do NOT wear cologne, perfume, aftershave, or lotions (deodorant is allowed). Please arrive 15 minutes prior to your appointment time.  Please note: We ask at that you not bring children with you during ultrasound (echo/ vascular) testing. Due to room size and safety concerns, children are  not allowed in the ultrasound rooms during exams. Our front office staff cannot provide observation of children in our lobby area while testing is being conducted. An adult accompanying a patient to their appointment will only be  allowed in the ultrasound room at the discretion of the ultrasound technician under special circumstances. We apologize for any inconvenience.    Follow-Up: At [facility] you and your health needs are our priority.  As part of our continuing mission to provide you with exceptional heart care, we have created designated Provider Care Teams.  These Care Teams include your primary Cardiologist (physician) and Advanced Practice Providers (APPs -  Physician Assistants and Nurse Practitioners) who all work together to provide you with the care you need, when you need it.  We recommend signing up for the patient portal called MyChart.  Sign up information is provided on this After Visit Summary.  MyChart is used to connect with patients for Virtual Visits (Telemedicine).  Patients are able to view lab/test results, encounter notes, upcoming appointments, etc.  Non-urgent messages can be sent to your provider as well.   To learn more about what you can do with MyChart, go to [email or web address].    Your next appointment:   2 month(s)  The format for your next appointment:   In Person  Provider:   Alean Kobus, MD   Other Instructions NA

## 2024-07-31 NOTE — Progress Notes (Signed)
 Cardiology Consultation:    Date:  07/31/2024   ID:  [name], DOB 1966-08-19, MRN [id number]  [facility]P:  [facility]  Cardiologist:  Alean SAUNDERS Kenzlee Fishburn, MD   Referring MD: Herlinda Kirk NOVAK, FNP   No chief complaint on file.    ASSESSMENT AND PLAN:   [name] 58 year old woman  history of leiomyoma, s/p hysterectomy, hypertension, anemia, former smoker [quit over 20 years ago], sleep apnea [tested 4 years ago, no CPAP] left bundle branch block noted on EKG incidentally at ER visit 07/27/2024. As ongoing symptoms of shortness of breath since her COVID-19 illness in 2020 and more recently after a repeat bout of COVID illness few months ago.  Problem List Items Addressed This Visit     Hypertension   Elevated suboptimal control. Target blood pressure below 130/80 mmHg.  Continue hydrochlorothiazide titrate up the dose to 25 mg once daily. Continue amlodipine 2.5 mg once daily. Add carvedilol 3.125 mg twice daily.  Unclear what her current cardiac functional status is. Hence we will hold off on titrating up amlodipine.  Will obtain transthoracic echocardiogram to assess cardiac structure and function in the setting of longstanding uncontrolled hypertension.  Advised to take low-salt diet.      LBBB (left bundle branch block) - Primary   New diagnosis.  Recently identified at ER visit October 2025.  Prior EKG for comparison from 2019 showed narrow QRS.  Etiology could be hypertensive heart disease related versus ischemia.  Will proceed with echocardiogram as reviewed above and hypertension. Given her symptoms of shortness of breath with exertion which are relatively chronic but progressive and now with LBBB morphology we will proceed with cardiac CT coronary angiogram to rule out any significant obstructive CAD.       Relevant Orders   EKG 12-Lead (Completed)   Dyspnea on exertion   Dyspnea on exertion relatively chronic but has been progressive  recently. Does have cardiovascular risk factors in the form of age, former smoking history, uncontrolled hypertension and now with new LBBB cannot exclude obstructive CAD.  Will proceed with echocardiogram for cardiac structure and function assessment and cardiac CT coronary angiogram. Use of contrast and radiation for the test reviewed.  Tentatively echocardiogram in [place] and cardiac CT to be done in [place] at [place] facility.  Advised to start taking aspirin 81 mg once daily while awaiting the test results       Relevant Orders   EKG 12-Lead (Completed)   Return to clinic tentatively in 2 months.   History of Present Illness:    [name] is a 58 y.o. female who is being seen today for the evaluation of hypertension and left bundle branch block at the request of Triplett, Cari B, FNP.   Pleasant woman here for the visit today accompanied by her daughter.  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  She lives in [place].  Has history of leiomyoma, s/p hysterectomy, hypertension, anemia, former smoker [quit over 20 years ago], sleep apnea [tested 4 years ago, no CPAP] left bundle branch block noted on EKG incidentally at ER visit 07/27/2024  Recently at emergency room at [facility] 07/27/2024 for uncontrolled blood pressures noted at PCPs office and sent to the ER where the blood pressures were 202/95 mmHg.  High-sensitivity troponin I x 2 was unremarkable 6, 6.  Basic metabolic panel and CBC was unremarkable.  Mentions was in her usual state of  health until 2020 when she had COVID-19 illness which was severe and since then feels her breathing has never fully recovered.  Few months ago she had another bout of COVID illness.  Has remained with symptoms of shortness of breath with exertion and functional status is somewhat limited due to right hip pain unable to walk up stairs but able to walk down and  able to do her work at the job.  She does notice getting short of breath with exertion. Denies any significant chest pain at rest.  Does report laying reclined rather than flat due to shortness of breath.  Does snore significantly at night.  No pedal edema Denies any palpitations, lightheadedness, dizziness or syncopal episodes. Does note heart rates tend to feel faster in general. Denies any blood in urine or stools.  Mentions no significant weight changes over the past few years.  Mentions blood pressures at home typically have remained elevated since she has been checking it for the past few weeks.  Mentions has been on amlodipine for over 1 to 2 years.  Quit smoking many years ago after her daughter was born. Does not consume alcohol. No illicit drug use. Drinks herbal tea at times.  EKG in the clinic today shows sinus rhythm heart rate 80/min, QRS wide 152 ms left bundle branch block morphology.  PR interval 180 ms.  In comparison similar to prior EKG from 07/27/2024.  Prior EKG from September 2019 showed narrow QRS.  Denies any significant family history of heart disease.    Past Medical History:  Diagnosis Date   Anemia    Anxiety    Family history of anesthesia complication    Leiomyoma 02/04/2013    Past Surgical History:  Procedure Laterality Date   ABDOMINAL HYSTERECTOMY     CESAREAN SECTION     LAPAROSCOPIC ASSISTED VAGINAL HYSTERECTOMY N/A 02/03/2013   Procedure: LAPAROSCOPIC ASSISTED VAGINAL HYSTERECTOMY;  Surgeon: Charlie CHRISTELLA Croak, MD;  Location: [facility];  Service: Gynecology;  Laterality: N/A;    Current Medications: Current Meds  Medication Sig   albuterol  (VENTOLIN  HFA) 108 (90 Base) MCG/ACT inhaler Inhale 2 puffs into the lungs every 4 (four) hours as needed for wheezing or shortness of breath.   amLODipine (NORVASC) 2.5 MG tablet Take 2.5 mg by mouth daily.   fluticasone  (FLONASE ) 50 MCG/ACT nasal spray Place 1 spray into both nostrils daily for 14 days.    guaiFENesin -codeine  100-10 MG/5ML syrup Take 10 mLs by mouth 3 (three) times daily as needed.   hydrochlorothiazide (MICROZIDE) 12.5 MG capsule Take 12.5 mg by mouth daily.   Vitamin D, Ergocalciferol, (DRISDOL) 1.25 MG (50000 UT) CAPS capsule Take 50,000 Units by mouth every 7 (seven) days.     Allergies:   Penicillins and Percocet [oxycodone-acetaminophen ]   Social History   Socioeconomic History   Marital status: Married    Spouse name: Not on file   Number of children: Not on file   Years of education: Not on file   Highest education level: Not on file  Occupational History   Not on file  Tobacco Use   Smoking status: Never   Smokeless tobacco: Never  Substance and Sexual Activity   Alcohol use: No   Drug use: No   Sexual activity: Not on file  Other Topics Concern   Not on file  Social History Narrative   Not on file   Social Drivers of Health   Financial Resource Strain: Not on file  Food Insecurity: Not on file  Transportation Needs: Not on file  Physical Activity: Not on file  Stress: Not on file  Social Connections: Not on file     Family History: The patient's family history includes Cancer in her father; Diabetes in her mother and sister. ROS:   Please see the history of present illness.    All 14 point review of systems negative except as described per history of present illness.  EKGs/Labs/Other Studies Reviewed:    The following studies were reviewed today:   EKG:  EKG Interpretation Date/Time:  Friday July 31 2024 10:04:23 EDT Ventricular Rate:  80 PR Interval:  180 QRS Duration:  152 QT Interval:  438 QTC Calculation: 505 R Axis:   -54  Text Interpretation: Normal sinus rhythm Left axis deviation Left bundle branch block Abnormal ECG When compared with ECG of 27-Jul-2024 16:22, QRS axis Shifted left T wave amplitude has increased in Inferior leads T wave amplitude has increased in Anterior leads T wave inversion now evident in Lateral  leads Confirmed by Liborio Hai reddy [phone]) on 07/31/2024 10:17:54 AM    Recent Labs: 07/27/2024: BUN 11; Creatinine, Ser 0.85; Hemoglobin 12.5; Platelets 359; Potassium 3.8; Sodium 142  Recent Lipid Panel No results found for: CHOL, TRIG, HDL, CHOLHDL, VLDL, LDLCALC, LDLDIRECT  Physical Exam:    VS:  BP (!) 168/110   Pulse 80   Ht 5' 4 (1.626 m)   Wt 257 lb 9.6 oz (116.8 kg)   LMP 01/15/2013   SpO2 97%   BMI 44.22 kg/m     Wt Readings from Last 3 Encounters:  07/31/24 257 lb 9.6 oz (116.8 kg)  07/27/24 205 lb 0.4 oz (93 kg)  09/05/22 205 lb 0.4 oz (93 kg)     GENERAL:  Well nourished, well developed in no acute distress NECK: No JVD; No carotid bruits CARDIAC: RRR, S1 and S2 present, no murmurs, no rubs, no gallops CHEST:  Clear to auscultation without rales, wheezing or rhonchi  Extremities: No pitting pedal edema. Pulses bilaterally symmetric with radial 2+ and dorsalis pedis 2+ NEUROLOGIC:  Alert and oriented x 3  Medication Adjustments/Labs and Tests Ordered: Current medicines are reviewed at length with the patient today.  Concerns regarding medicines are outlined above.  Orders Placed This Encounter  Procedures   EKG 12-Lead   No orders of the defined types were placed in this encounter.   Signed, Hai jess Liborio, MD, MPH, [facility]. 07/31/2024 10:51 AM     [facility]

## 2024-07-31 NOTE — Assessment & Plan Note (Addendum)
 Dyspnea on exertion relatively chronic but has been progressive recently. Does have cardiovascular risk factors in the form of age, former smoking history, uncontrolled hypertension and now with new LBBB cannot exclude obstructive CAD.  Will proceed with echocardiogram for cardiac structure and function assessment and cardiac CT coronary angiogram. Use of contrast and radiation for the test reviewed.  Tentatively echocardiogram in [place] and cardiac CT to be done in [place] at [place] facility.  Advised to start taking aspirin 81 mg once daily while awaiting the test results

## 2024-07-31 NOTE — Assessment & Plan Note (Addendum)
 Elevated suboptimal control. Target blood pressure below 130/80 mmHg.  Continue hydrochlorothiazide titrate up the dose to 25 mg once daily. Continue amlodipine 2.5 mg once daily. Add carvedilol 3.125 mg twice daily.  Unclear what her current cardiac functional status is. Hence we will hold off on titrating up amlodipine.  Will obtain transthoracic echocardiogram to assess cardiac structure and function in the setting of longstanding uncontrolled hypertension.  Advised to take low-salt diet.

## 2024-08-10 ENCOUNTER — Telehealth (HOSPITAL_COMMUNITY): Payer: Self-pay | Admitting: *Deleted

## 2024-08-10 NOTE — Telephone Encounter (Signed)
 Received call from patient regarding upcoming cardiac imaging study; pt verbalizes understanding of appt date/time, parking situation and where to check in, pre-test NPO status and medications ordered, and verified current allergies; name and call back number provided for further questions should they arise Sid Seats RN Navigator Cardiac Imaging [facility] [phone] office [phone] cell

## 2024-08-10 NOTE — Telephone Encounter (Signed)
 Attempted to call patient regarding upcoming cardiac CT appointment. Left message on voicemail with name and callback number Sid Seats RN Navigator Cardiac Imaging [facility] [phone] Office

## 2024-08-11 ENCOUNTER — Ambulatory Visit (HOSPITAL_COMMUNITY)
Admission: RE | Admit: 2024-08-11 | Discharge: 2024-08-11 | Disposition: A | Discharge status: Home / Self Care | Source: Ambulatory Visit

## 2024-08-11 DIAGNOSIS — R072 Precordial pain: Secondary | ICD-10-CM | POA: Insufficient documentation

## 2024-08-11 MED ORDER — IOHEXOL 350 MG/ML SOLN
100.0000 mL | Freq: Once | INTRAVENOUS | Status: AC | PRN
  Administered 2024-08-11: 100 mL via INTRAVENOUS

## 2024-08-11 MED ORDER — NITROGLYCERIN 0.4 MG SL SUBL
0.8000 mg | SUBLINGUAL_TABLET | Freq: Once | SUBLINGUAL | Status: AC
  Administered 2024-08-11: 0.8 mg via SUBLINGUAL

## 2024-08-18 ENCOUNTER — Ambulatory Visit

## 2024-09-14 ENCOUNTER — Ambulatory Visit

## 2024-10-06 ENCOUNTER — Ambulatory Visit

## 2024-10-07 ENCOUNTER — Ambulatory Visit

## 2024-10-15 ENCOUNTER — Ambulatory Visit
